# Patient Record
Sex: Female | Born: 1990
Health system: Southern US, Community
[De-identification: ages and names within clinical notes are randomized; demographics above are authoritative.]

## PROBLEM LIST (undated history)

## (undated) DIAGNOSIS — Z789 Other specified health status: Secondary | ICD-10-CM

## (undated) HISTORY — DX: Other specified health status: Z78.9

## (undated) HISTORY — PX: WISDOM TOOTH EXTRACTION: SHX21

---

## 2011-03-10 DIAGNOSIS — B009 Herpesviral infection, unspecified: Secondary | ICD-10-CM | POA: Insufficient documentation

## 2018-08-01 HISTORY — PX: PLACEMENT OF BREAST IMPLANTS: SHX6334

## 2019-04-20 ENCOUNTER — Other Ambulatory Visit: Payer: Self-pay

## 2019-04-20 DIAGNOSIS — Z20822 Contact with and (suspected) exposure to covid-19: Secondary | ICD-10-CM

## 2019-04-21 LAB — NOVEL CORONAVIRUS, NAA: SARS-CoV-2, NAA: NOT DETECTED

## 2019-04-22 DIAGNOSIS — Z20828 Contact with and (suspected) exposure to other viral communicable diseases: Secondary | ICD-10-CM | POA: Diagnosis not present

## 2019-05-30 DIAGNOSIS — L709 Acne, unspecified: Secondary | ICD-10-CM | POA: Diagnosis not present

## 2019-09-24 DIAGNOSIS — Z20828 Contact with and (suspected) exposure to other viral communicable diseases: Secondary | ICD-10-CM | POA: Diagnosis not present

## 2019-09-26 DIAGNOSIS — Z30432 Encounter for removal of intrauterine contraceptive device: Secondary | ICD-10-CM | POA: Diagnosis not present

## 2019-09-30 DIAGNOSIS — Z20828 Contact with and (suspected) exposure to other viral communicable diseases: Secondary | ICD-10-CM | POA: Diagnosis not present

## 2019-10-18 DIAGNOSIS — R05 Cough: Secondary | ICD-10-CM | POA: Diagnosis not present

## 2019-12-02 ENCOUNTER — Encounter: Payer: Self-pay | Admitting: Obstetrics

## 2019-12-02 ENCOUNTER — Other Ambulatory Visit: Payer: Self-pay

## 2019-12-02 ENCOUNTER — Inpatient Hospital Stay (HOSPITAL_COMMUNITY)
Admission: AD | Admit: 2019-12-02 | Discharge: 2019-12-02 | Disposition: A | Discharge status: Home / Self Care | Payer: BC Managed Care – PPO | Attending: Obstetrics and Gynecology | Admitting: Obstetrics and Gynecology

## 2019-12-02 DIAGNOSIS — M549 Dorsalgia, unspecified: Secondary | ICD-10-CM | POA: Diagnosis not present

## 2019-12-02 DIAGNOSIS — R1030 Lower abdominal pain, unspecified: Secondary | ICD-10-CM | POA: Diagnosis not present

## 2019-12-02 DIAGNOSIS — R109 Unspecified abdominal pain: Secondary | ICD-10-CM | POA: Diagnosis not present

## 2019-12-02 DIAGNOSIS — Z3490 Encounter for supervision of normal pregnancy, unspecified, unspecified trimester: Secondary | ICD-10-CM

## 2019-12-02 DIAGNOSIS — Z3202 Encounter for pregnancy test, result negative: Secondary | ICD-10-CM

## 2019-12-02 LAB — WET PREP, GENITAL
Clue Cells Wet Prep HPF POC: NONE SEEN
Sperm: NONE SEEN
Trich, Wet Prep: NONE SEEN
Yeast Wet Prep HPF POC: NONE SEEN

## 2019-12-02 LAB — HCG, QUANTITATIVE, PREGNANCY: hCG, Beta Chain, Quant, S: 17 m[IU]/mL — ABNORMAL HIGH (ref <5)

## 2019-12-02 MED ORDER — ACETAMINOPHEN 500 MG PO TABS: 1000.0000 mg | ORAL_TABLET | Freq: Once | ORAL | Status: DC

## 2019-12-02 MED ORDER — PREPLUS 27-1 MG PO TABS: 1.0000 | ORAL_TABLET | Freq: Once | ORAL | 3 refills | Status: AC

## 2019-12-02 NOTE — MAU Note (Signed)
Lacey Guerrero is a 29 y.o. here in MAU reporting: constant cramping since Friday. No bleeding, no discharge. 2 + UPT at home (last night and this morning).  LMP: 10/10/19  Onset of complaint: Friday  Pain score: 5/10  Vitals:   12/02/19 1418  BP: (!) 100/58  Pulse: 75  Resp: 16  Temp: 98.4 F (36.9 C)  SpO2: 100%     Lab orders placed from triage: UPT, hcg

## 2019-12-02 NOTE — Discharge Instructions (Signed)
First Trimester of Pregnancy The first trimester of pregnancy is from week 1 until the end of week 13 (months 1 through 3). A week after a sperm fertilizes an egg, the egg will implant on the wall of the uterus. This embryo will begin to develop into a baby. Genes from you and your partner will form the baby. The female genes will determine whether the baby will be a boy or a girl. At 6-8 weeks, the eyes and face will be formed, and the heartbeat can be seen on ultrasound. At the end of 12 weeks, all the baby's organs will be formed. Now that you are pregnant, you will want to do everything you can to have a healthy baby. Two of the most important things are to get good prenatal care and to follow your health care provider's instructions. Prenatal care is all the medical care you receive before the baby's birth. This care will help prevent, find, and treat any problems during the pregnancy and childbirth. Body changes during your first trimester Your body goes through many changes during pregnancy. The changes vary from woman to woman.  You may gain or lose a couple of pounds at first.  You may feel sick to your stomach (nauseous) and you may throw up (vomit). If the vomiting is uncontrollable, call your health care provider.  You may tire easily.  You may develop headaches that can be relieved by medicines. All medicines should be approved by your health care provider.  You may urinate more often. Painful urination may mean you have a bladder infection.  You may develop heartburn as a result of your pregnancy.  You may develop constipation because certain hormones are causing the muscles that push stool through your intestines to slow down.  You may develop hemorrhoids or swollen veins (varicose veins).  Your breasts may begin to grow larger and become tender. Your nipples may stick out more, and the tissue that surrounds them (areola) may become darker.  Your gums may bleed and may be  sensitive to brushing and flossing.  Dark spots or blotches (chloasma, mask of pregnancy) may develop on your face. This will likely fade after the baby is born.  Your menstrual periods will stop.  You may have a loss of appetite.  You may develop cravings for certain kinds of food.  You may have changes in your emotions from day to day, such as being excited to be pregnant or being concerned that something may go wrong with the pregnancy and baby.  You may have more vivid and strange dreams.  You may have changes in your hair. These can include thickening of your hair, rapid growth, and changes in texture. Some women also have hair loss during or after pregnancy, or hair that feels dry or thin. Your hair will most likely return to normal after your baby is born. What to expect at prenatal visits During a routine prenatal visit:  You will be weighed to make sure you and the baby are growing normally.  Your blood pressure will be taken.  Your abdomen will be measured to track your baby's growth.  The fetal heartbeat will be listened to between weeks 10 and 14 of your pregnancy.  Test results from any previous visits will be discussed. Your health care provider may ask you:  How you are feeling.  If you are feeling the baby move.  If you have had any abnormal symptoms, such as leaking fluid, bleeding, severe headaches, or abdominal   cramping.  If you are using any tobacco products, including cigarettes, chewing tobacco, and electronic cigarettes.  If you have any questions. Other tests that may be performed during your first trimester include:  Blood tests to find your blood type and to check for the presence of any previous infections. The tests will also be used to check for low iron levels (anemia) and protein on red blood cells (Rh antibodies). Depending on your risk factors, or if you previously had diabetes during pregnancy, you may have tests to check for high blood sugar  that affects pregnant women (gestational diabetes).  Urine tests to check for infections, diabetes, or protein in the urine.  An ultrasound to confirm the proper growth and development of the baby.  Fetal screens for spinal cord problems (spina bifida) and Down syndrome.  HIV (human immunodeficiency virus) testing. Routine prenatal testing includes screening for HIV, unless you choose not to have this test.  You may need other tests to make sure you and the baby are doing well. Follow these instructions at home: Medicines  Follow your health care provider's instructions regarding medicine use. Specific medicines may be either safe or unsafe to take during pregnancy.  Take a prenatal vitamin that contains at least 600 micrograms (mcg) of folic acid.  If you develop constipation, try taking a stool softener if your health care provider approves. Eating and drinking   Eat a balanced diet that includes fresh fruits and vegetables, whole grains, good sources of protein such as meat, eggs, or tofu, and low-fat dairy. Your health care provider will help you determine the amount of weight gain that is right for you.  Avoid raw meat and uncooked cheese. These carry germs that can cause birth defects in the baby.  Eating four or five small meals rather than three large meals a day may help relieve nausea and vomiting. If you start to feel nauseous, eating a few soda crackers can be helpful. Drinking liquids between meals, instead of during meals, also seems to help ease nausea and vomiting.  Limit foods that are high in fat and processed sugars, such as fried and sweet foods.  To prevent constipation: ? Eat foods that are high in fiber, such as fresh fruits and vegetables, whole grains, and beans. ? Drink enough fluid to keep your urine clear or pale yellow. Activity  Exercise only as directed by your health care provider. Most women can continue their usual exercise routine during  pregnancy. Try to exercise for 30 minutes at least 5 days a week. Exercising will help you: ? Control your weight. ? Stay in shape. ? Be prepared for labor and delivery.  Experiencing pain or cramping in the lower abdomen or lower back is a good sign that you should stop exercising. Check with your health care provider before continuing with normal exercises.  Try to avoid standing for long periods of time. Move your legs often if you must stand in one place for a long time.  Avoid heavy lifting.  Wear low-heeled shoes and practice good posture.  You may continue to have sex unless your health care provider tells you not to. Relieving pain and discomfort  Wear a good support bra to relieve breast tenderness.  Take warm sitz baths to soothe any pain or discomfort caused by hemorrhoids. Use hemorrhoid cream if your health care provider approves.  Rest with your legs elevated if you have leg cramps or low back pain.  If you develop varicose veins in   your legs, wear support hose. Elevate your feet for 15 minutes, 3-4 times a day. Limit salt in your diet. Prenatal care  Schedule your prenatal visits by the twelfth week of pregnancy. They are usually scheduled monthly at first, then more often in the last 2 months before delivery.  Write down your questions. Take them to your prenatal visits.  Keep all your prenatal visits as told by your health care provider. This is important. Safety  Wear your seat belt at all times when driving.  Make a list of emergency phone numbers, including numbers for family, friends, the hospital, and police and fire departments. General instructions  Ask your health care provider for a referral to a local prenatal education class. Begin classes no later than the beginning of month 6 of your pregnancy.  Ask for help if you have counseling or nutritional needs during pregnancy. Your health care provider can offer advice or refer you to specialists for help  with various needs.  Do not use hot tubs, steam rooms, or saunas.  Do not douche or use tampons or scented sanitary pads.  Do not cross your legs for long periods of time.  Avoid cat litter boxes and soil used by cats. These carry germs that can cause birth defects in the baby and possibly loss of the fetus by miscarriage or stillbirth.  Avoid all smoking, herbs, alcohol, and medicines not prescribed by your health care provider. Chemicals in these products affect the formation and growth of the baby.  Do not use any products that contain nicotine or tobacco, such as cigarettes and e-cigarettes. If you need help quitting, ask your health care provider. You may receive counseling support and other resources to help you quit.  Schedule a dentist appointment. At home, brush your teeth with a soft toothbrush and be gentle when you floss. Contact a health care provider if:  You have dizziness.  You have mild pelvic cramps, pelvic pressure, or nagging pain in the abdominal area.  You have persistent nausea, vomiting, or diarrhea.  You have a bad smelling vaginal discharge.  You have pain when you urinate.  You notice increased swelling in your face, hands, legs, or ankles.  You are exposed to fifth disease or chickenpox.  You are exposed to German measles (rubella) and have never had it. Get help right away if:  You have a fever.  You are leaking fluid from your vagina.  You have spotting or bleeding from your vagina.  You have severe abdominal cramping or pain.  You have rapid weight gain or loss.  You vomit blood or material that looks like coffee grounds.  You develop a severe headache.  You have shortness of breath.  You have any kind of trauma, such as from a fall or a car accident. Summary  The first trimester of pregnancy is from week 1 until the end of week 13 (months 1 through 3).  Your body goes through many changes during pregnancy. The changes vary from  woman to woman.  You will have routine prenatal visits. During those visits, your health care provider will examine you, discuss any test results you may have, and talk with you about how you are feeling. This information is not intended to replace advice given to you by your health care provider. Make sure you discuss any questions you have with your health care provider. Document Revised: 06/30/2017 Document Reviewed: 06/29/2016 Elsevier Patient Education  2020 Elsevier Inc.  

## 2019-12-02 NOTE — MAU Provider Note (Signed)
First Provider Initiated Contact with Patient 12/02/19 1427      S Lacey Guerrero is a 29 y.o. with no obstetric history on file. She presents to MAU today with complaint of abdominal cramping and back pain. Patient reports she had a positive pregnancy test this morning and 3 positive tests yesterday morning.  She reports March 11th was LMP, but she had negative home UPT 2 weeks ago. She reports some lower abdominal cramping and back pain that she describes as "menstrual cramping."   Patient points to lower abdominal upper pelvic area.  She reports that she had a Paragard IUD removed at the end of Feb.  She states she was having "pretty regular" menstrual cycles while on the IUD. Patient states that she has been experiencing cramping since Friday.   O BP (!) 100/58 (BP Location: Right Arm)   Pulse 75   Temp 98.4 F (36.9 C) (Oral)   Resp 16   LMP 10/10/2019   SpO2 100% Comment: room air Physical Exam  Constitutional: She is oriented to person, place, and time. She appears well-developed and well-nourished.  HENT:  Head: Normocephalic and atraumatic.  Eyes: Conjunctivae are normal.  Cardiovascular: Normal rate.  Respiratory: Effort normal.  GI: There is abdominal tenderness in the suprapubic area.  Right side pressure.  Genitourinary: Uterus is not enlarged and not tender. Cervix exhibits friability. Cervix exhibits no motion tenderness and no discharge.    Vaginal discharge present.     No vaginal bleeding.  No bleeding in the vagina.    Genitourinary Comments: Speculum Exam: -Normal External Genitalia: Non tender, no apparent discharge at introitus.  -Vaginal Vault: Pink mucosa with good rugae. Small amt thin white discharge -wet prep collected -Cervix:Pink, no lesions, cysts, or polyps.  Friable. Appears closed. No active bleeding from os-GC/CT collected -Bimanual Exam:      Musculoskeletal:        General: Normal range of motion.     Cervical back: Normal range of motion.   Neurological: She is alert and oriented to person, place, and time.  Skin: Skin is warm and dry.  Psychiatric: She has a normal mood and affect. Her behavior is normal.   Results for orders placed or performed during the hospital encounter of 12/02/19 (from the past 24 hour(s))  hCG, quantitative, pregnancy     Status: Abnormal   Collection Time: 12/02/19  2:24 PM  Result Value Ref Range   hCG, Beta Chain, Quant, S 17 (H) <5 mIU/mL  Wet prep, genital     Status: Abnormal   Collection Time: 12/02/19  3:42 PM   Specimen: PATH Cytology Cervicovaginal Ancillary Only  Result Value Ref Range   Yeast Wet Prep HPF POC NONE SEEN NONE SEEN   Trich, Wet Prep NONE SEEN NONE SEEN   Clue Cells Wet Prep HPF POC NONE SEEN NONE SEEN   WBC, Wet Prep HPF POC FEW (A) NONE SEEN   Sperm NONE SEEN     A Positive Home UPT/Negative UPT on site Medical screening exam complete  Abdominal Cramping Back Pain   P -Quant performed and returns at 64. -Results discussed with patient. -Patient informed of need for repeat testing in 48 hours and reports she lives in [place]. -Scheduled for repeat quant on Wednesday May 5th at 1330. -Patient with various questions and concerns regarding pregnancy, risks for miscarriage, which were addressed thoroughly by provider. -Patient appears reassured by discussion and informaiton provided. -Offered pelvic examination with testing to rule out underlying  infection as reason for abdominal cramping. -Patient desires and exam as above. -Cultures collected -Patient offered and declines pain medication. -Will send results via mychart. -Written script given for PNV. -Warning signs for worsening condition that would warrant emergency follow-up discussed -Discharge from MAU in stable condition. -Patient may return to MAU as needed for pregnancy related complaints  Gerrit Heck, CNM 12/02/2019 2:27 PM

## 2019-12-03 LAB — GC/CHLAMYDIA PROBE AMP (~~LOC~~) NOT AT ARMC
Chlamydia: NEGATIVE
Comment: NEGATIVE
Comment: NORMAL
Neisseria Gonorrhea: NEGATIVE

## 2019-12-04 ENCOUNTER — Ambulatory Visit (INDEPENDENT_AMBULATORY_CARE_PROVIDER_SITE_OTHER): Payer: BC Managed Care – PPO | Admitting: Family Medicine

## 2019-12-04 ENCOUNTER — Ambulatory Visit: Payer: BC Managed Care – PPO

## 2019-12-04 ENCOUNTER — Other Ambulatory Visit: Payer: BC Managed Care – PPO

## 2019-12-04 ENCOUNTER — Other Ambulatory Visit: Payer: Self-pay

## 2019-12-04 DIAGNOSIS — O039 Complete or unspecified spontaneous abortion without complication: Secondary | ICD-10-CM | POA: Diagnosis not present

## 2019-12-04 DIAGNOSIS — Z3490 Encounter for supervision of normal pregnancy, unspecified, unspecified trimester: Secondary | ICD-10-CM | POA: Diagnosis not present

## 2019-12-04 LAB — BETA HCG QUANT (REF LAB): hCG Quant: 6 m[IU]/mL

## 2019-12-04 NOTE — Progress Notes (Signed)
Patient is here to discuss her results. HCG 6

## 2019-12-04 NOTE — Progress Notes (Signed)
    MISCARRIAGE follow up  Subjective:   Lacey Guerrero is a 29 y.o. [phone] female here for miscarriage. Reports starting with cramping and bleeding this morning.  Reports being very sad and also emotional this AM. Brought her mother her for support. This was desired pregnancy, trying for second child.  Denies excessive bleeding, dizziness, lightheadedness and abdominal pain.   The following portions of the patient's history were reviewed and updated as appropriate: allergies, current medications, past family history, past medical history, past social history, past surgical history and problem list.  Review of Systems Pertinent items are noted in HPI.   Objective:  There were no vitals taken for this visit. Gen: well appearing, NAD HEENT: no scleral icterus CV: RR Lung: Normal WOB Ext: warm well perfused  5/3 bhCG= 17 5/5 bHCG= 6   Assessment and Plan:  1. Miscarriage within last 12 months - reviewed preconception health  - reviewed contraception and basic fertility. Patient desires pregnancy ASAP and will use condoms until next period. Recommended waiting to try until after next normal period. Reviewed that miscarriage is common and we might do a work up for clotting disorders if she experiences another miscarriage.  - provided resources and support regarding pregnancy loss.    Face to face time:  20 minutes  Greater than 50% of the visit time was spent in counseling and coordination of care with the patient.  The summary and outline of the counseling and care coordination is summarized in the note above.   All questions were answered.  Please refer to After Visit Summary for other counseling recommendations.   Return if symptoms worsen or fail to improve.

## 2019-12-06 ENCOUNTER — Encounter: Payer: Self-pay | Admitting: Family Medicine

## 2020-01-20 ENCOUNTER — Other Ambulatory Visit: Payer: Self-pay

## 2020-01-20 ENCOUNTER — Other Ambulatory Visit: Payer: BC Managed Care – PPO

## 2020-01-20 DIAGNOSIS — Z3201 Encounter for pregnancy test, result positive: Secondary | ICD-10-CM | POA: Diagnosis not present

## 2020-01-21 LAB — BETA HCG QUANT (REF LAB): hCG Quant: 20736 m[IU]/mL

## 2020-02-11 ENCOUNTER — Ambulatory Visit (INDEPENDENT_AMBULATORY_CARE_PROVIDER_SITE_OTHER): Payer: BC Managed Care – PPO | Admitting: Obstetrics and Gynecology

## 2020-02-11 ENCOUNTER — Encounter: Payer: Self-pay | Admitting: Obstetrics and Gynecology

## 2020-02-11 ENCOUNTER — Other Ambulatory Visit (HOSPITAL_COMMUNITY)
Admission: RE | Admit: 2020-02-11 | Discharge: 2020-02-11 | Disposition: A | Discharge status: Home / Self Care | Payer: BC Managed Care – PPO | Source: Ambulatory Visit | Attending: Obstetrics and Gynecology | Admitting: Obstetrics and Gynecology

## 2020-02-11 ENCOUNTER — Other Ambulatory Visit: Payer: Self-pay

## 2020-02-11 VITALS — BP 116/75 | HR 91 | Ht 66.0 in | Wt 144.0 lb

## 2020-02-11 DIAGNOSIS — O09291 Supervision of pregnancy with other poor reproductive or obstetric history, first trimester: Secondary | ICD-10-CM

## 2020-02-11 DIAGNOSIS — O99611 Diseases of the digestive system complicating pregnancy, first trimester: Secondary | ICD-10-CM

## 2020-02-11 DIAGNOSIS — K429 Umbilical hernia without obstruction or gangrene: Secondary | ICD-10-CM | POA: Insufficient documentation

## 2020-02-11 DIAGNOSIS — Z3A09 9 weeks gestation of pregnancy: Secondary | ICD-10-CM

## 2020-02-11 DIAGNOSIS — O210 Mild hyperemesis gravidarum: Secondary | ICD-10-CM

## 2020-02-11 DIAGNOSIS — Z3401 Encounter for supervision of normal first pregnancy, first trimester: Secondary | ICD-10-CM | POA: Insufficient documentation

## 2020-02-11 DIAGNOSIS — O09891 Supervision of other high risk pregnancies, first trimester: Secondary | ICD-10-CM | POA: Insufficient documentation

## 2020-02-11 DIAGNOSIS — Z3480 Encounter for supervision of other normal pregnancy, unspecified trimester: Secondary | ICD-10-CM | POA: Insufficient documentation

## 2020-02-11 MED ORDER — BONJESTA 20-20 MG PO TBCR: 1.0000 | EXTENDED_RELEASE_TABLET | Freq: Every day | ORAL | 1 refills | Status: DC

## 2020-02-11 NOTE — Progress Notes (Signed)
Pt. Request medication for nausea

## 2020-02-12 ENCOUNTER — Telehealth: Payer: Self-pay

## 2020-02-12 LAB — CERVICOVAGINAL ANCILLARY ONLY
Chlamydia: NEGATIVE
Comment: NEGATIVE
Comment: NORMAL
Neisseria Gonorrhea: NEGATIVE

## 2020-02-12 NOTE — Progress Notes (Signed)
New OB Note  02/11/2020   [facility]inic: [facility]  Chief Complaint: new OB  Transfer of Care Patient: no  History of Present Illness: Ms. Lacey Guerrero is a 29 y.o. G3P1011 @ 9/0 weeks (EDC 2/15, based on 9wk u/s today).  Preg complicated by has Encounter for supervision of normal first pregnancy in first trimester; Medication exposure during first trimester of pregnancy; and Umbilical hernia without obstruction and without gangrene on their problem list.   Any events prior to today's visit: patient had recent miscarriage in May and no LMP but she believes her EDC is what we saw on ultrasound today based on date of conception Her periods were: qmonth, regular prior to recent miscarriage She was using no method when she conceived.  She has Positive signs or symptoms of nausea/vomiting of pregnancy. She has Negative signs or symptoms of miscarriage or preterm labor On any medications around the time she conceived/early pregnancy: retinol cream which she stopped very soon after home UPT  ROS: A 12-point review of systems was performed and negative, except as stated in the above HPI.  OBGYN History: As per HPI. OB History  Gravida Para Term Preterm AB Living  3 1 1   1 1   SAB TAB Ectopic Multiple Live Births  1       1    # Outcome Date GA Lbr Len/2nd Weight Sex Delivery Anes PTL Lv  3 Current           2 SAB 11/2019          1 Term 02/17/17 [redacted]w[redacted]d  6 lb 7.4 oz (2.93 kg)  Vag-Spont  N LIV    Any issues with any prior pregnancies: no Prior children are healthy, doing well, and without any problems or issues: yes History of pap smears: Yes. Last pap smear 2019 and results were negative   Past Medical History: No past medical history on file.  Past Surgical History: Past Surgical History:  Procedure Laterality Date  . PLACEMENT OF BREAST IMPLANTS Bilateral 2020    Family History:  No family history on file.  Social History:  Social History    Socioeconomic History  . Marital status: Married    Spouse name: Not on file  . Number of children: Not on file  . Years of education: Not on file  . Highest education level: Not on file  Occupational History  . Not on file  Tobacco Use  . Smoking status: Not on file  Substance and Sexual Activity  . Alcohol use: Not on file  . Drug use: Not on file  . Sexual activity: Not on file  Other Topics Concern  . Not on file  Social History Narrative  . Not on file   Social Determinants of Health   Financial Resource Strain:   . Difficulty of Paying Living Expenses:   Food Insecurity:   . Worried About 2021 in the Last Year:   . Programme researcher, broadcasting/film/video in the Last Year:   Transportation Needs:   . Barista (Medical):   Freight forwarder Lack of Transportation (Non-Medical):   Physical Activity:   . Days of Exercise per Week:   . Minutes of Exercise per Session:   Stress:   . Feeling of Stress :   Social Connections:   . Frequency of Communication with Friends and Family:   . Frequency of Social Gatherings with Friends and Family:   . Attends Religious Services:   .  Active Member of Clubs or Organizations:   . Attends Banker Meetings:   Marland Kitchen Marital Status:   Intimate Partner Violence:   . Fear of Current or Ex-Partner:   . Emotionally Abused:   Marland Kitchen Physically Abused:   . Sexually Abused:     Allergy: Not on File  Health Maintenance:  Mammogram Up to Date: not applicable  Current Outpatient Medications: Prenatal vitamin  Physical Exam:   BP 116/75   Pulse 91   Ht 5\' 6"  (1.676 m)   Wt 144 lb (65.3 kg)   LMP 10/10/2019   BMI 23.24 kg/m  Body mass index is 23.24 kg/m. Contractions: Not present Vag. Bleeding: None. Fundal height: not applicable FHTs: 180s  General appearance: Well nourished, well developed female in no acute distress.  Neck:  Supple, normal appearance, and no thyromegaly  Cardiovascular: S1, S2 normal, no murmur, rub or  gallop, regular rate and rhythm Respiratory:  Clear to auscultation bilateral. Normal respiratory effort Abdomen: soft, nttp, nd. Small 2cm umbilical hernia noted, nttp Breasts: bilateral breast implants, no obvious masses, no discharge. Well healed surgical scars. Neuro/Psych:  Normal mood and affect.  Skin:  Warm and dry.   Laboratory: none  Imaging:  Bedside u/s-SLIUP, +FM, 9/0 via CRL, FHR 180s  Assessment: pt doing well  Plan: 1. Encounter for supervision of normal first pregnancy in first trimester Routine care. Bonjesta sent in. Pt to come back at 10 weeks or greater for NIPS testing - CBC/D/Plt+RPR+Rh+ABO+Rub Ab...; Future - Culture, OB Urine - Genetic Screening - Enroll patient in Babyscripts Program - 12/10/2019 MFM Fetal Nuchal Translucency; Future - Cervicovaginal ancillary only( [place]) - VITAMIN D 25 Hydroxy (Vit-D Deficiency, Fractures); Future  2. Medication exposure during first trimester of pregnancy Will get NT u/s but I told her that likely shouldn't be an issue  3. Umbilical hernia without obstruction and without gangrene Precautions noted. She wonders about fixing this and I told her I recommend after her pregnancy and when she is done with childbearing. ED precautions given  4. Morning sickness  Problem list reviewed and updated.  Follow up in 1wk for labs and 4-5 weeks for ROB  The nature of  - [facility] with multiple MDs and other Advanced Practice Providers was explained to patient; also emphasized that residents, students are part of our team. She desires that she doesn't want residents, students. I d/w her CNMs, fellows, Attending coverage.   >50% of 30 min visit spent on counseling and coordination of care.     [facility] MD Attending [facility] for [facility])

## 2020-02-12 NOTE — Telephone Encounter (Signed)
Patient called stating her insurance company is requiring a PA for the [place]. I have submitted all documentation to her bc/bs insurance company for review. The turn around time for this is 24-48. Patient has been made aware of this.

## 2020-02-13 ENCOUNTER — Telehealth: Payer: Self-pay

## 2020-02-13 LAB — URINE CULTURE, OB REFLEX

## 2020-02-13 LAB — CULTURE, OB URINE

## 2020-02-13 NOTE — Telephone Encounter (Signed)
Patient insurance approved her taking bonjesta but it is to expensive. Per Dr.Anyanwu called in and Phenergan  25 mg prn Q 6 and  Reglan 10 mg po qh prn nausea. If these two medication do not work she can try Zofran.This has not been called in to her pharmacy.

## 2020-02-18 ENCOUNTER — Other Ambulatory Visit: Payer: BC Managed Care – PPO

## 2020-02-18 ENCOUNTER — Other Ambulatory Visit: Payer: Self-pay

## 2020-02-18 DIAGNOSIS — Z3401 Encounter for supervision of normal first pregnancy, first trimester: Secondary | ICD-10-CM

## 2020-02-18 DIAGNOSIS — R5383 Other fatigue: Secondary | ICD-10-CM | POA: Diagnosis not present

## 2020-02-19 LAB — CBC/D/PLT+RPR+RH+ABO+RUB AB...
Antibody Screen: NEGATIVE
Basophils Absolute: 0 10*3/uL (ref 0.0–0.2)
Basos: 0 %
EOS (ABSOLUTE): 0.1 10*3/uL (ref 0.0–0.4)
Eos: 1 %
HCV Ab: <0.1 s/co ratio (ref 0.0–0.9)
HIV Screen 4th Generation wRfx: NONREACTIVE
Hematocrit: 37.2 % (ref 34.0–46.6)
Hemoglobin: 13 g/dL (ref 11.1–15.9)
Hepatitis B Surface Ag: NEGATIVE
Immature Grans (Abs): 0 10*3/uL (ref 0.0–0.1)
Immature Granulocytes: 0 %
Lymphocytes Absolute: 1.7 10*3/uL (ref 0.7–3.1)
Lymphs: 18 %
MCH: 31.6 pg (ref 26.6–33.0)
MCHC: 34.9 g/dL (ref 31.5–35.7)
MCV: 91 fL (ref 79–97)
Monocytes Absolute: 0.4 10*3/uL (ref 0.1–0.9)
Monocytes: 4 %
Neutrophils Absolute: 7.4 10*3/uL — ABNORMAL HIGH (ref 1.4–7.0)
Neutrophils: 77 %
Platelets: 222 10*3/uL (ref 150–450)
RBC: 4.11 x10E6/uL (ref 3.77–5.28)
RDW: 11.8 % (ref 11.7–15.4)
RPR Ser Ql: NONREACTIVE
Rh Factor: POSITIVE
Rubella Antibodies, IGG: 1.52 index (ref >0.99)
WBC: 9.6 10*3/uL (ref 3.4–10.8)

## 2020-02-19 LAB — HCV INTERPRETATION

## 2020-02-19 LAB — VITAMIN D 25 HYDROXY (VIT D DEFICIENCY, FRACTURES): Vit D, 25-Hydroxy: 36.1 ng/mL (ref 30.0–100.0)

## 2020-02-25 ENCOUNTER — Telehealth: Payer: Self-pay | Admitting: *Deleted

## 2020-02-25 ENCOUNTER — Encounter: Payer: Self-pay | Admitting: *Deleted

## 2020-02-25 NOTE — Telephone Encounter (Signed)
Left message for pt to call back regarding her panorama results.  

## 2020-02-25 NOTE — Telephone Encounter (Signed)
Pt informed of panorama results  ?

## 2020-03-10 ENCOUNTER — Encounter: Payer: BC Managed Care – PPO | Admitting: Obstetrics & Gynecology

## 2020-03-11 ENCOUNTER — Ambulatory Visit: Payer: BC Managed Care – PPO

## 2020-03-16 ENCOUNTER — Other Ambulatory Visit: Payer: Self-pay | Admitting: *Deleted

## 2020-03-16 ENCOUNTER — Ambulatory Visit: Payer: BC Managed Care – PPO

## 2020-03-16 ENCOUNTER — Other Ambulatory Visit: Payer: Self-pay | Admitting: Obstetrics and Gynecology

## 2020-03-16 ENCOUNTER — Ambulatory Visit (HOSPITAL_BASED_OUTPATIENT_CLINIC_OR_DEPARTMENT_OTHER): Payer: BC Managed Care – PPO

## 2020-03-16 ENCOUNTER — Other Ambulatory Visit: Payer: Self-pay

## 2020-03-16 ENCOUNTER — Ambulatory Visit: Payer: BC Managed Care – PPO | Attending: Obstetrics and Gynecology | Admitting: *Deleted

## 2020-03-16 ENCOUNTER — Encounter: Payer: Self-pay | Admitting: *Deleted

## 2020-03-16 VITALS — BP 108/78 | HR 71

## 2020-03-16 DIAGNOSIS — F1590 Other stimulant use, unspecified, uncomplicated: Secondary | ICD-10-CM | POA: Diagnosis not present

## 2020-03-16 DIAGNOSIS — Z363 Encounter for antenatal screening for malformations: Secondary | ICD-10-CM | POA: Diagnosis not present

## 2020-03-16 DIAGNOSIS — Z3A13 13 weeks gestation of pregnancy: Secondary | ICD-10-CM

## 2020-03-16 DIAGNOSIS — Z3401 Encounter for supervision of normal first pregnancy, first trimester: Secondary | ICD-10-CM

## 2020-03-16 DIAGNOSIS — Z369 Encounter for antenatal screening, unspecified: Secondary | ICD-10-CM

## 2020-03-16 DIAGNOSIS — O99321 Drug use complicating pregnancy, first trimester: Secondary | ICD-10-CM | POA: Insufficient documentation

## 2020-03-31 ENCOUNTER — Encounter: Payer: Self-pay | Admitting: Obstetrics and Gynecology

## 2020-03-31 ENCOUNTER — Other Ambulatory Visit: Payer: Self-pay

## 2020-03-31 ENCOUNTER — Ambulatory Visit (INDEPENDENT_AMBULATORY_CARE_PROVIDER_SITE_OTHER): Payer: BC Managed Care – PPO | Admitting: Obstetrics and Gynecology

## 2020-03-31 VITALS — BP 108/67 | HR 71 | Wt 149.2 lb

## 2020-03-31 DIAGNOSIS — Z3401 Encounter for supervision of normal first pregnancy, first trimester: Secondary | ICD-10-CM | POA: Diagnosis not present

## 2020-03-31 DIAGNOSIS — K429 Umbilical hernia without obstruction or gangrene: Secondary | ICD-10-CM

## 2020-03-31 NOTE — Progress Notes (Signed)
   PRENATAL VISIT NOTE  Subjective:  Lacey Guerrero is a 29 y.o. G3P1011 at [redacted]w[redacted]d being seen today for ongoing prenatal care.  She is currently monitored for the following issues for this low-risk pregnancy and has Encounter for supervision of normal first pregnancy in first trimester; Medication exposure during first trimester of pregnancy; and Umbilical hernia without obstruction and without gangrene on their problem list.  Patient reports no complaints.  Contractions: Not present.  .  Movement: Present. Denies leaking of fluid.   The following portions of the patient's history were reviewed and updated as appropriate: allergies, current medications, past family history, past medical history, past social history, past surgical history and problem list.   Objective:   Vitals:   03/31/20 1357  BP: 108/67  Pulse: 71  Weight: 149 lb 3.2 oz (67.7 kg)    Fetal Status: Fetal Heart Rate (bpm): 143   Movement: Present     General:  Alert, oriented and cooperative. Patient is in no acute distress.  Skin: Skin is warm and dry. No rash noted.   Cardiovascular: Normal heart rate noted  Respiratory: Normal respiratory effort, no problems with respiration noted  Abdomen: Soft, gravid, appropriate for gestational age.  Pain/Pressure: Absent     Pelvic: Cervical exam deferred        Extremities: Normal range of motion.     Mental Status: Normal mood and affect. Normal behavior. Normal judgment and thought content.   Assessment and Plan:  Pregnancy: G3P1011 at [redacted]w[redacted]d 1. Encounter for supervision of normal first pregnancy in first trimester Patient is doing well without complaints Anatomy ultrasound scheduled 9/20 AFP today  2. Umbilical hernia without obstruction and without gangrene Stable  Preterm labor symptoms and general obstetric precautions including but not limited to vaginal bleeding, contractions, leaking of fluid and fetal movement were reviewed in detail with the patient. Please  refer to After Visit Summary for other counseling recommendations.   Return in about 4 weeks (around 04/28/2020) for in person, ROB, Low risk.  Future Appointments  Date Time Provider Department Center  04/20/2020  2:45 PM [facility] US4 [facility]    Catalina Antigua, MD

## 2020-04-01 DIAGNOSIS — Z20822 Contact with and (suspected) exposure to covid-19: Secondary | ICD-10-CM | POA: Diagnosis not present

## 2020-04-01 LAB — AFP, SERUM, OPEN SPINA BIFIDA
AFP MoM: 1.11
AFP Value: 38.3 ng/mL
Gest. Age on Collection Date: 16 weeks
Maternal Age At EDD: 29.2 yr
OSBR Risk 1 IN: 8647
Test Results:: NEGATIVE
Weight: 149 [lb_av]

## 2020-04-20 ENCOUNTER — Ambulatory Visit: Payer: BC Managed Care – PPO | Attending: Obstetrics and Gynecology

## 2020-04-20 ENCOUNTER — Other Ambulatory Visit: Payer: Self-pay

## 2020-04-20 ENCOUNTER — Other Ambulatory Visit: Payer: Self-pay | Admitting: Obstetrics and Gynecology

## 2020-04-20 DIAGNOSIS — O358XX0 Maternal care for other (suspected) fetal abnormality and damage, not applicable or unspecified: Secondary | ICD-10-CM

## 2020-04-20 DIAGNOSIS — Z363 Encounter for antenatal screening for malformations: Secondary | ICD-10-CM

## 2020-04-20 DIAGNOSIS — O321XX0 Maternal care for breech presentation, not applicable or unspecified: Secondary | ICD-10-CM | POA: Diagnosis not present

## 2020-04-20 DIAGNOSIS — Z3A18 18 weeks gestation of pregnancy: Secondary | ICD-10-CM | POA: Diagnosis not present

## 2020-04-21 ENCOUNTER — Other Ambulatory Visit: Payer: Self-pay | Admitting: *Deleted

## 2020-04-21 DIAGNOSIS — Z3689 Encounter for other specified antenatal screening: Secondary | ICD-10-CM

## 2020-04-28 ENCOUNTER — Other Ambulatory Visit: Payer: Self-pay

## 2020-04-28 ENCOUNTER — Ambulatory Visit (INDEPENDENT_AMBULATORY_CARE_PROVIDER_SITE_OTHER): Payer: BC Managed Care – PPO | Admitting: Family Medicine

## 2020-04-28 DIAGNOSIS — Z3401 Encounter for supervision of normal first pregnancy, first trimester: Secondary | ICD-10-CM

## 2020-04-28 DIAGNOSIS — L709 Acne, unspecified: Secondary | ICD-10-CM | POA: Insufficient documentation

## 2020-04-28 NOTE — Progress Notes (Signed)
Discuss acne meds

## 2020-04-28 NOTE — Progress Notes (Signed)
   PRENATAL VISIT NOTE  Subjective:  Lacey Guerrero is a 29 y.o. G3P1011 at [redacted]w[redacted]d being seen today for ongoing prenatal care.  She is currently monitored for the following issues for this low-risk pregnancy and has Encounter for supervision of normal first pregnancy in first trimester; Medication exposure during first trimester of pregnancy; Umbilical hernia without obstruction and without gangrene; Acne; and Herpes simplex type 1 infection on their problem list.  Patient reports no complaints.  Contractions: Not present. Vag. Bleeding: None.  Movement: Present. Denies leaking of fluid.   The following portions of the patient's history were reviewed and updated as appropriate: allergies, current medications, past family history, past medical history, past social history, past surgical history and problem list.   Objective:   Vitals:   04/28/20 1349  BP: 103/68  Pulse: 69  Weight: 154 lb (69.9 kg)    Fetal Status: Fetal Heart Rate (bpm): 168 Fundal Height: 20 cm Movement: Present     General:  Alert, oriented and cooperative. Patient is in no acute distress.  Skin: Skin is warm and dry. No rash noted.   Cardiovascular: Normal heart rate noted  Respiratory: Normal respiratory effort, no problems with respiration noted  Abdomen: Soft, gravid, appropriate for gestational age.  Pain/Pressure: Absent     Pelvic: Cervical exam deferred        Extremities: Normal range of motion.  Edema: None  Mental Status: Normal mood and affect. Normal behavior. Normal judgment and thought content.   Assessment and Plan:  Pregnancy: G3P1011 at [redacted]w[redacted]d 1. Encounter for supervision of normal first pregnancy in first trimester Continue routine prenatal care. Anatomy US WNL AFP nml  2. Acne Most topical treatments ok Reviewed meds, and no retin-As noted  General obstetric precautions including but not limited to vaginal bleeding, contractions, leaking of fluid and fetal movement were reviewed in detail  with the patient. Please refer to After Visit Summary for other counseling recommendations.   Return in 4 weeks (on 05/26/2020).  Future Appointments  Date Time Provider Department Center  05/27/2020  2:10 PM Briant Sites [facility]  06/01/2020 11:30 AM [facility] US3 [facility]  06/01/2020  1:00 PM [facility] NURSE [facility]    Reva Bores, MD

## 2020-04-28 NOTE — Patient Instructions (Signed)

## 2020-05-27 ENCOUNTER — Ambulatory Visit (INDEPENDENT_AMBULATORY_CARE_PROVIDER_SITE_OTHER): Payer: BC Managed Care – PPO | Admitting: Advanced Practice Midwife

## 2020-05-27 ENCOUNTER — Other Ambulatory Visit: Payer: Self-pay

## 2020-05-27 VITALS — BP 107/67 | HR 66 | Wt 160.0 lb

## 2020-05-27 DIAGNOSIS — Z3402 Encounter for supervision of normal first pregnancy, second trimester: Secondary | ICD-10-CM

## 2020-05-27 DIAGNOSIS — Z23 Encounter for immunization: Secondary | ICD-10-CM

## 2020-05-27 DIAGNOSIS — Z9882 Breast implant status: Secondary | ICD-10-CM

## 2020-05-27 NOTE — Patient Instructions (Signed)
Glucose Tolerance Test During Pregnancy Why am I having this test? The glucose tolerance test (GTT) is done to check how your body processes sugar (glucose). This is one of several tests used to diagnose diabetes that develops during pregnancy (gestational diabetes mellitus). Gestational diabetes is a temporary form of diabetes that some women develop during pregnancy. It usually occurs during the second trimester of pregnancy and goes away after delivery. Testing (screening) for gestational diabetes usually occurs between 24 and 28 weeks of pregnancy. You may have the GTT test after having a 1-hour glucose screening test if the results from that test indicate that you may have gestational diabetes. You may also have this test if:  You have a history of gestational diabetes.  You have a history of giving birth to very large babies or have experienced repeated fetal loss (stillbirth).  You have signs and symptoms of diabetes, such as: ? Changes in your vision. ? Tingling or numbness in your hands or feet. ? Changes in hunger, thirst, and urination that are not otherwise explained by your pregnancy. What is being tested? This test measures the amount of glucose in your blood at different times during a period of 3 hours. This indicates how well your body is able to process glucose. What kind of sample is taken?  Blood samples are required for this test. They are usually collected by inserting a needle into a blood vessel. How do I prepare for this test?  For 3 days before your test, eat normally. Have plenty of carbohydrate-rich foods.  Follow instructions from your health care provider about: ? Eating or drinking restrictions on the day of the test. You may be asked to not eat or drink anything other than water (fast) starting 8-10 hours before the test. ? Changing or stopping your regular medicines. Some medicines may interfere with this test. Tell a health care provider about:  All  medicines you are taking, including vitamins, herbs, eye drops, creams, and over-the-counter medicines.  Any blood disorders you have.  Any surgeries you have had.  Any medical conditions you have. What happens during the test? First, your blood glucose will be measured. This is referred to as your fasting blood glucose, since you fasted before the test. Then, you will drink a glucose solution that contains a certain amount of glucose. Your blood glucose will be measured again 1, 2, and 3 hours after drinking the solution. This test takes about 3 hours to complete. You will need to stay at the testing location during this time. During the testing period:  Do not eat or drink anything other than the glucose solution.  Do not exercise.  Do not use any products that contain nicotine or tobacco, such as cigarettes and e-cigarettes. If you need help stopping, ask your health care provider. The testing procedure may vary among health care providers and hospitals. How are the results reported? Your results will be reported as milligrams of glucose per deciliter of blood (mg/dL) or millimoles per liter (mmol/L). Your health care provider will compare your results to normal ranges that were established after testing a large group of people (reference ranges). Reference ranges may vary among labs and hospitals. For this test, common reference ranges are:  Fasting: less than 95-105 mg/dL (5.3-5.8 mmol/L).  1 hour after drinking glucose: less than 180-190 mg/dL (10.0-10.5 mmol/L).  2 hours after drinking glucose: less than 155-165 mg/dL (8.6-9.2 mmol/L).  3 hours after drinking glucose: 140-145 mg/dL (7.8-8.1 mmol/L). What do the   results mean? Results within reference ranges are considered normal, meaning that your glucose levels are well-controlled. If two or more of your blood glucose levels are high, you may be diagnosed with gestational diabetes. If only one level is high, your health care  provider may suggest repeat testing or other tests to confirm a diagnosis. Talk with your health care provider about what your results mean. Questions to ask your health care provider Ask your health care provider, or the department that is doing the test:  When will my results be ready?  How will I get my results?  What are my treatment options?  What other tests do I need?  What are my next steps? Summary  The glucose tolerance test (GTT) is one of several tests used to diagnose diabetes that develops during pregnancy (gestational diabetes mellitus). Gestational diabetes is a temporary form of diabetes that some women develop during pregnancy.  You may have the GTT test after having a 1-hour glucose screening test if the results from that test indicate that you may have gestational diabetes. You may also have this test if you have any symptoms or risk factors for gestational diabetes.  Talk with your health care provider about what your results mean. This information is not intended to replace advice given to you by your health care provider. Make sure you discuss any questions you have with your health care provider. Document Revised: 11/08/2018 Document Reviewed: 02/27/2017 Elsevier Patient Education  2020 Elsevier Inc.  

## 2020-05-27 NOTE — Progress Notes (Signed)
   PRENATAL VISIT NOTE  Subjective:  Lacey Guerrero is a 29 y.o. G3P1011 at [redacted]w[redacted]d being seen today for ongoing prenatal care.  She is currently monitored for the following issues for this low-risk pregnancy and has Encounter for supervision of normal first pregnancy in first trimester; Medication exposure during first trimester of pregnancy; Umbilical hernia without obstruction and without gangrene; Acne; and Herpes simplex type 1 infection on their problem list.  Patient reports concerns regarding "small bumps" on the medial aspect of her right breast. Patient also endorses intermittent tenderness at the same point. She does not typically wear an underwire bra but does sleep in a bra. She denies palpable lumps or tenderness today. She is beginning to feel nervous about breastfeeding s/p augmentation.  Contractions: Not present. Vag. Bleeding: None.  Movement: Present. Denies leaking of fluid.   The following portions of the patient's history were reviewed and updated as appropriate: allergies, current medications, past family history, past medical history, past social history, past surgical history and problem list. Problem list updated.  Objective:   Vitals:   05/27/20 1415  BP: 107/67  Pulse: 66  Weight: 160 lb (72.6 kg)    Fetal Status: Fetal Heart Rate (bpm): 152 Fundal Height: 24 cm Movement: Present     General:  Alert, oriented and cooperative. Patient is in no acute distress.  Skin: Skin is warm and dry. No rash noted.   Cardiovascular: Normal heart rate noted  Respiratory: Normal respiratory effort, no problems with respiration noted  Abdomen: Soft, gravid, appropriate for gestational age.  Pain/Pressure: Absent     Pelvic: Cervical exam deferred        Extremities: Normal range of motion.  Edema: None  Mental Status: Normal mood and affect. Normal behavior. Normal judgment and thought content.   Assessment and Plan:  Pregnancy: G3P1011 at [redacted]w[redacted]d  1. Encounter for supervision  of normal first pregnancy in second trimester - EFW 87%, upcoming repeat MFM scan - Preemptive teaching GTT next visit  2. Hx of breast augmentation -No palpable lumps or tenderness on exam today today, continue to monitor - Reassured augmentation does not necessarily influence milk supply   Preterm labor symptoms and general obstetric precautions including but not limited to vaginal bleeding, contractions, leaking of fluid and fetal movement were reviewed in detail with the patient. Please refer to After Visit Summary for other counseling recommendations.  Return in about 4 weeks (around 06/24/2020) for GTT and LOB.  Future Appointments  Date Time Provider Department Center  06/01/2020 11:30 AM [facility] US3 [facility]  07/01/2020  8:15 AM Alvester Morin, Isa Rankin, MD [facility]    Calvert Cantor, CNM

## 2020-06-01 ENCOUNTER — Ambulatory Visit: Payer: BC Managed Care – PPO | Attending: Obstetrics and Gynecology

## 2020-06-01 ENCOUNTER — Ambulatory Visit: Payer: BC Managed Care – PPO

## 2020-06-01 ENCOUNTER — Other Ambulatory Visit: Payer: Self-pay

## 2020-06-01 DIAGNOSIS — Z363 Encounter for antenatal screening for malformations: Secondary | ICD-10-CM | POA: Diagnosis not present

## 2020-06-01 DIAGNOSIS — Z3A25 25 weeks gestation of pregnancy: Secondary | ICD-10-CM

## 2020-06-01 DIAGNOSIS — O358XX0 Maternal care for other (suspected) fetal abnormality and damage, not applicable or unspecified: Secondary | ICD-10-CM

## 2020-06-01 DIAGNOSIS — Z3689 Encounter for other specified antenatal screening: Secondary | ICD-10-CM | POA: Diagnosis not present

## 2020-07-01 ENCOUNTER — Encounter: Payer: Self-pay | Admitting: Family Medicine

## 2020-07-01 ENCOUNTER — Ambulatory Visit (INDEPENDENT_AMBULATORY_CARE_PROVIDER_SITE_OTHER): Payer: BC Managed Care – PPO | Admitting: Family Medicine

## 2020-07-01 ENCOUNTER — Other Ambulatory Visit: Payer: Self-pay

## 2020-07-01 VITALS — BP 104/71 | HR 73 | Wt 163.8 lb

## 2020-07-01 DIAGNOSIS — Z3480 Encounter for supervision of other normal pregnancy, unspecified trimester: Secondary | ICD-10-CM

## 2020-07-01 DIAGNOSIS — Z23 Encounter for immunization: Secondary | ICD-10-CM

## 2020-07-01 DIAGNOSIS — Z3403 Encounter for supervision of normal first pregnancy, third trimester: Secondary | ICD-10-CM

## 2020-07-01 DIAGNOSIS — Z3402 Encounter for supervision of normal first pregnancy, second trimester: Secondary | ICD-10-CM | POA: Diagnosis not present

## 2020-07-01 DIAGNOSIS — F419 Anxiety disorder, unspecified: Secondary | ICD-10-CM

## 2020-07-01 LAB — CBC
Hematocrit: 35.8 % (ref 34.0–46.6)
Hemoglobin: 12.5 g/dL (ref 11.1–15.9)
MCH: 32.5 pg (ref 26.6–33.0)
MCHC: 34.9 g/dL (ref 31.5–35.7)
MCV: 93 fL (ref 79–97)
Platelets: 173 10*3/uL (ref 150–450)
RBC: 3.85 x10E6/uL (ref 3.77–5.28)
RDW: 11.8 % (ref 11.7–15.4)
WBC: 7.3 10*3/uL (ref 3.4–10.8)

## 2020-07-01 NOTE — Progress Notes (Signed)
Covid Booster: 06/06/2020

## 2020-07-01 NOTE — Progress Notes (Signed)
   PRENATAL VISIT NOTE  Subjective:  Lacey Guerrero is a 29 y.o. G3P1011 at [redacted]w[redacted]d being seen today for ongoing prenatal care.  She is currently monitored for the following issues for this low-risk pregnancy and has Supervision of other normal pregnancy, antepartum; Medication exposure during first trimester of pregnancy; Umbilical hernia without obstruction and without gangrene; Acne; and Herpes simplex type 1 infection on their problem list.  Patient reports no complaints.  Contractions: Not present. Vag. Bleeding: None.  Movement: Present. Denies leaking of fluid.   The following portions of the patient's history were reviewed and updated as appropriate: allergies, current medications, past family history, past medical history, past social history, past surgical history and problem list.   Objective:   Vitals:   07/01/20 0834  BP: 104/71  Pulse: 73  Weight: 163 lb 12.8 oz (74.3 kg)    Fetal Status: Fetal Heart Rate (bpm): 154 Fundal Height: 29 cm Movement: Present     General:  Alert, oriented and cooperative. Patient is in no acute distress.  Skin: Skin is warm and dry. No rash noted.   Cardiovascular: Normal heart rate noted  Respiratory: Normal respiratory effort, no problems with respiration noted  Abdomen: Soft, gravid, appropriate for gestational age.  Pain/Pressure: Present     Pelvic: Cervical exam deferred        Extremities: Normal range of motion.  Edema: None  Mental Status: Normal mood and affect. Normal behavior. Normal judgment and thought content.   Assessment and Plan:  Pregnancy: G3P1011 at [redacted]w[redacted]d  1. Encounter for supervision of normal first pregnancy in third trimester - Glucose Tolerance, 2 Hours w/1 Hour - CBC - RPR - HIV Antibody (routine testing w rflx)  2. Anxiety Reports having anxiety, no SI/HI but notes increased stress with 29 yo. Find her self perseverating about small decisions and "worrying." We discussed starting counseling and possible  medication in the future if sx worsen. She is ammenable to [facility] referr today - Ambulatory referral to [facility]  3. Supervision of other normal pregnancy, antepartum Doing well  Desires LC visit to discuss breastfeeding after breast implants. Will schedule at next visit Reviewed available BF classes through [facility] Pumped x 1 yr with current 29 yo and fed at the breast for 6 months. Will have have patient come for [facility] specific discussion  Preterm labor symptoms and general obstetric precautions including but not limited to vaginal bleeding, contractions, leaking of fluid and fetal movement were reviewed in detail with the patient. Please refer to After Visit Summary for other counseling recommendations.   Return in about 2 weeks (around 07/15/2020) for Routine prenatal care.  Future Appointments  Date Time Provider Department Center  07/15/2020  2:30 PM Federico Flake, MD [facility]  07/29/2020  2:30 PM Federico Flake, MD [facility]  08/19/2020  2:30 PM Calvert Cantor, CNM [facility]  08/26/2020  2:30 PM Federico Flake, MD [facility]    Federico Flake, MD

## 2020-07-02 LAB — HIV ANTIBODY (ROUTINE TESTING W REFLEX): HIV Screen 4th Generation wRfx: NONREACTIVE

## 2020-07-02 LAB — GLUCOSE TOLERANCE, 2 HOURS W/ 1HR
Glucose, 1 hour: 113 mg/dL (ref 65–179)
Glucose, 2 hour: 88 mg/dL (ref 65–152)
Glucose, Fasting: 66 mg/dL (ref 65–91)

## 2020-07-02 LAB — RPR: RPR Ser Ql: NONREACTIVE

## 2020-07-15 ENCOUNTER — Other Ambulatory Visit: Payer: Self-pay

## 2020-07-15 ENCOUNTER — Ambulatory Visit (INDEPENDENT_AMBULATORY_CARE_PROVIDER_SITE_OTHER): Payer: BC Managed Care – PPO | Admitting: Family Medicine

## 2020-07-15 VITALS — BP 111/69 | HR 63 | Wt 167.0 lb

## 2020-07-15 DIAGNOSIS — B009 Herpesviral infection, unspecified: Secondary | ICD-10-CM

## 2020-07-15 DIAGNOSIS — Z9882 Breast implant status: Secondary | ICD-10-CM

## 2020-07-15 DIAGNOSIS — Z3A31 31 weeks gestation of pregnancy: Secondary | ICD-10-CM

## 2020-07-15 DIAGNOSIS — Z3483 Encounter for supervision of other normal pregnancy, third trimester: Secondary | ICD-10-CM

## 2020-07-15 DIAGNOSIS — Z3480 Encounter for supervision of other normal pregnancy, unspecified trimester: Secondary | ICD-10-CM

## 2020-07-15 DIAGNOSIS — K429 Umbilical hernia without obstruction or gangrene: Secondary | ICD-10-CM

## 2020-07-15 NOTE — Progress Notes (Signed)
   PRENATAL VISIT NOTE  Subjective:  Lacey Guerrero is a 29 y.o. G3P1011 at [redacted]w[redacted]d being seen today for ongoing prenatal care.  She is currently monitored for the following issues for this low-risk pregnancy and has Supervision of other normal pregnancy, antepartum; Medication exposure during first trimester of pregnancy; Umbilical hernia without obstruction and without gangrene; Acne; and Herpes simplex type 1 infection on their problem list.  Patient reports no complaints.  Contractions: Not present. Vag. Bleeding: None.  Movement: Present. Denies leaking of fluid.   The following portions of the patient's history were reviewed and updated as appropriate: allergies, current medications, past family history, past medical history, past social history, past surgical history and problem list.   Objective:   Vitals:   07/15/20 1443  BP: 111/69  Pulse: 63  Weight: 167 lb (75.8 kg)    Fetal Status: Fetal Heart Rate (bpm): 145   Movement: Present     General:  Alert, oriented and cooperative. Patient is in no acute distress.  Skin: Skin is warm and dry. No rash noted.   Cardiovascular: Normal heart rate noted  Respiratory: Normal respiratory effort, no problems with respiration noted  Abdomen: Soft, gravid, appropriate for gestational age.  Pain/Pressure: Present     Pelvic: Cervical exam deferred        Extremities: Normal range of motion.     Mental Status: Normal mood and affect. Normal behavior. Normal judgment and thought content.   Assessment and Plan:  Pregnancy: G3P1011 at [redacted]w[redacted]d 1. Supervision of other normal pregnancy, antepartum Up to date haivng pressure but appears normal, will monitor sx.   2. Umbilical hernia without obstruction and without gangrene Unchanged, no complaints  3. Herpes simplex type 1 infection  4. Hx of breast augmentation See lactation note.   Preterm labor symptoms and general obstetric precautions including but not limited to vaginal bleeding,  contractions, leaking of fluid and fetal movement were reviewed in detail with the patient. Please refer to After Visit Summary for other counseling recommendations.   Return in about 2 weeks (around 07/29/2020) for Routine prenatal care.  Future Appointments  Date Time Provider Department Center  07/29/2020  2:30 PM Federico Flake, MD [facility]  08/19/2020  2:30 PM Calvert Cantor, CNM [facility]  08/26/2020  2:30 PM Federico Flake, MD [facility]    Federico Flake, MD

## 2020-07-15 NOTE — Progress Notes (Signed)
   LACTATION CONSULTANT ASSESSMENT   S: Patient requested LC visit due to interval breast augmentation since last nursing/bm expression. Patient has breast implants, they were placed under the muscle. Surgery was in [place].  She BM fed her last child for 1 yr. They nursed at the breast for 6 months and then mother pumped and provided BM until 1 year.   O: BP 111/69   Pulse 63   Wt 167 lb (75.8 kg)   LMP 10/10/2019   BMI 26.95 kg/m    Breast: Symmetric bilaterally. Inferior scars consistent with below muscle placement.   R: able to see appropriately increased vasculature. Everted nipples. Glandular tissue palpated normally. L: able to see appropriately increased vasculature.Everted nipples. Glandular tissue palpated normally.  Assessment and Plan: # Breast Augmentation: Discussed her surgery and that it appears to have been a surgical type that preserves future breastmilk expression. Reassured that glandular tissues appears to be be recruiting blood supply evidenced by vasculature on breasts bilaterally. Counseled that it is hard to know how exactly surgery will effect lactation until after arrival of baby.  Mother does have extensive experience with pumping for last child.  - Recommend in hospital assessment by [facility] - Recommend outpatient IBCLC at 1 week to assess nursing dyad - Would benefit from weighted feed outpatient.  -Reviewed importance of draining breast every 2-3 hrs to help stimulate milk to come in.   [phone]

## 2020-07-21 DIAGNOSIS — Z3A32 32 weeks gestation of pregnancy: Secondary | ICD-10-CM | POA: Diagnosis not present

## 2020-07-21 DIAGNOSIS — Z20822 Contact with and (suspected) exposure to covid-19: Secondary | ICD-10-CM | POA: Diagnosis not present

## 2020-07-29 ENCOUNTER — Telehealth (INDEPENDENT_AMBULATORY_CARE_PROVIDER_SITE_OTHER): Payer: BC Managed Care – PPO | Admitting: Family Medicine

## 2020-07-29 ENCOUNTER — Other Ambulatory Visit: Payer: Self-pay

## 2020-07-29 DIAGNOSIS — Z3A33 33 weeks gestation of pregnancy: Secondary | ICD-10-CM

## 2020-07-29 DIAGNOSIS — Z3480 Encounter for supervision of other normal pregnancy, unspecified trimester: Secondary | ICD-10-CM

## 2020-07-29 DIAGNOSIS — Z3483 Encounter for supervision of other normal pregnancy, third trimester: Secondary | ICD-10-CM

## 2020-07-29 NOTE — Progress Notes (Signed)
I connected with Lacey Guerrero 07/29/20 at  2:30 PM EST by: MyChart video and verified that I am speaking with the correct person using two identifiers.  Patient is located at Home- address in epic and provider is located at [facility].     The purpose of this virtual visit is to provide medical care while limiting exposure to the novel coronavirus. I discussed the limitations, risks, security and privacy concerns of performing an evaluation and management service by MyChart video and the availability of in person appointments. I also discussed with the patient that there may be a patient responsible charge related to this service. By engaging in this virtual visit, you consent to the provision of healthcare.  Additionally, you authorize for your insurance to be billed for the services provided during this visit.  The patient expressed understanding and agreed to proceed.  The following staff members participated in the virtual visit:  Judie Bonus CMA    PRENATAL VISIT NOTE  Subjective:  Lacey Guerrero is a 29 y.o. G3P1011 at [redacted]w[redacted]d  for phone visit for ongoing prenatal care.  She is currently monitored for the following issues for this low-risk pregnancy and has Supervision of other normal pregnancy, antepartum; Medication exposure during first trimester of pregnancy; Umbilical hernia without obstruction and without gangrene; Acne; and Herpes simplex type 1 infection on their problem list.  Patient reports no complaints.  Contractions: Not present. Vag. Bleeding: None.  Movement: Present. Denies leaking of fluid.   The following portions of the patient's history were reviewed and updated as appropriate: allergies, current medications, past family history, past medical history, past social history, past surgical history and problem list.   Objective:  There were no vitals filed for this visit. Self-Obtained  Fetal Status:     Movement: Present     Assessment and Plan:  Pregnancy: G3P1011 at  [redacted]w[redacted]d  1. Supervision of other normal pregnancy, antepartum Up to date Changed to virtual due to lack of childcare Reassured that Valtrex safe in pregnancy Reviewed hand expression and starting at full term 38-39 wks Reviewed after hours care  Preterm labor symptoms and general obstetric precautions including but not limited to vaginal bleeding, contractions, leaking of fluid and fetal movement were reviewed in detail with the patient.  Return in about 2 weeks (around 08/12/2020) for Routine prenatal care, in person.  Future Appointments  Date Time Provider Department Center  08/19/2020  2:30 PM Calvert Cantor, [place] [facility]  08/26/2020  2:30 PM Federico Flake, MD [facility]     Time spent on virtual visit: 10 minutes  Federico Flake, MD

## 2020-08-01 NOTE — L&D Delivery Note (Signed)
Delivery Note About an hour after AROM, vtx was at +2 station.  After a 2 ctx 2nd stage, at 12:32 AM a viable female was delivered via Vaginal, Spontaneous (Presentation: Left Occiput Anterior).  APGAR: 9, 9; weight pending.  After 1 minute, the cord was clamped and cut. 40 units of pitocin diluted in 1000cc LR was infused rapidly IV.  The placenta separated spontaneously and delivered via CCT and maternal pushing effort.  It was inspected and appears to be intact with a 3 VC.    Anesthesia: Epidural Episiotomy: None Lacerations: 1st degree Suture Repair: 3.0 chromic Est. Blood Loss (mL): 478  Mom to postpartum.  Baby to Couplet care / Skin to Skin.  Scarlette Calico Cresenzo-Dishmon 08/25/2020, 1:11 AM

## 2020-08-14 ENCOUNTER — Encounter: Payer: Self-pay | Admitting: *Deleted

## 2020-08-19 ENCOUNTER — Other Ambulatory Visit: Payer: Self-pay

## 2020-08-19 ENCOUNTER — Ambulatory Visit (INDEPENDENT_AMBULATORY_CARE_PROVIDER_SITE_OTHER): Payer: BC Managed Care – PPO | Admitting: Advanced Practice Midwife

## 2020-08-19 ENCOUNTER — Other Ambulatory Visit (HOSPITAL_COMMUNITY)
Admission: RE | Admit: 2020-08-19 | Discharge: 2020-08-19 | Disposition: A | Discharge status: Home / Self Care | Payer: BC Managed Care – PPO | Source: Ambulatory Visit | Attending: Advanced Practice Midwife | Admitting: Advanced Practice Midwife

## 2020-08-19 VITALS — BP 122/80 | HR 58 | Wt 172.4 lb

## 2020-08-19 DIAGNOSIS — Z3480 Encounter for supervision of other normal pregnancy, unspecified trimester: Secondary | ICD-10-CM | POA: Diagnosis not present

## 2020-08-19 DIAGNOSIS — Z3A36 36 weeks gestation of pregnancy: Secondary | ICD-10-CM

## 2020-08-19 NOTE — Progress Notes (Signed)
   PRENATAL VISIT NOTE  Subjective:  Lacey Guerrero is a 30 y.o. G3P1011 at 101w1d being seen today for ongoing prenatal care.  She is currently monitored for the following issues for this low-risk pregnancy and has Supervision of other normal pregnancy, antepartum; Medication exposure during first trimester of pregnancy; Umbilical hernia without obstruction and without gangrene; Acne; and Herpes simplex type 1 infection on their problem list.  Patient reports no complaints.  Contractions: Not present. Vag. Bleeding: None.  Movement: Present. Denies leaking of fluid.   The following portions of the patient's history were reviewed and updated as appropriate: allergies, current medications, past family history, past medical history, past social history, past surgical history and problem list. Problem list updated.  Objective:   Vitals:   08/19/20 1454  BP: 122/80  Pulse: (!) 58  Weight: 172 lb 6.4 oz (78.2 kg)    Fetal Status: Fetal Heart Rate (bpm): 129 Fundal Height: 34 cm Movement: Present  Presentation: Vertex  General:  Alert, oriented and cooperative. Patient is in no acute distress.  Skin: Skin is warm and dry. No rash noted.   Cardiovascular: Normal heart rate noted  Respiratory: Normal respiratory effort, no problems with respiration noted  Abdomen: Soft, gravid, appropriate for gestational age.  Pain/Pressure: Present     Pelvic: Cervical exam performed Dilation: Closed Effacement (%): Thick Station: Ballotable  Extremities: Normal range of motion.     Mental Status: Normal mood and affect. Normal behavior. Normal judgment and thought content.   Assessment and Plan:  Pregnancy: G3P1011 at [redacted]w[redacted]d  1. Supervision of other normal pregnancy, antepartum - LOB, no acute concerns - Reviewed minimal impact of GBS positive result on labor course - Strep Gp B NAA - GC/Chlamydia probe amp ([place])not at [facility]  2. [redacted] weeks gestation of pregnancy   Preterm labor symptoms and  general obstetric precautions including but not limited to vaginal bleeding, contractions, leaking of fluid and fetal movement were reviewed in detail with the patient. Please refer to After Visit Summary for other counseling recommendations.    Future Appointments  Date Time Provider Department Center  08/26/2020  2:30 PM Federico Flake, MD [facility]    [place], [place]

## 2020-08-19 NOTE — Patient Instructions (Signed)

## 2020-08-21 LAB — STREP GP B NAA: Strep Gp B NAA: NEGATIVE

## 2020-08-21 LAB — GC/CHLAMYDIA PROBE AMP (~~LOC~~) NOT AT ARMC
Chlamydia: NEGATIVE
Comment: NEGATIVE
Comment: NORMAL
Neisseria Gonorrhea: NEGATIVE

## 2020-08-24 ENCOUNTER — Encounter (HOSPITAL_COMMUNITY): Payer: Self-pay | Admitting: Obstetrics and Gynecology

## 2020-08-24 ENCOUNTER — Inpatient Hospital Stay (HOSPITAL_COMMUNITY): Payer: BC Managed Care – PPO | Admitting: Anesthesiology

## 2020-08-24 ENCOUNTER — Other Ambulatory Visit: Payer: Self-pay

## 2020-08-24 ENCOUNTER — Inpatient Hospital Stay (HOSPITAL_COMMUNITY)
Admission: AD | Admit: 2020-08-24 | Discharge: 2020-08-26 | DRG: 807 | Disposition: A | Discharge status: Home / Self Care | Payer: BC Managed Care – PPO | Attending: Obstetrics and Gynecology | Admitting: Obstetrics and Gynecology

## 2020-08-24 ENCOUNTER — Ambulatory Visit (INDEPENDENT_AMBULATORY_CARE_PROVIDER_SITE_OTHER): Payer: BC Managed Care – PPO | Admitting: Obstetrics and Gynecology

## 2020-08-24 VITALS — BP 122/81 | HR 63 | Wt 175.0 lb

## 2020-08-24 DIAGNOSIS — Z8759 Personal history of other complications of pregnancy, childbirth and the puerperium: Secondary | ICD-10-CM

## 2020-08-24 DIAGNOSIS — O139 Gestational [pregnancy-induced] hypertension without significant proteinuria, unspecified trimester: Secondary | ICD-10-CM

## 2020-08-24 DIAGNOSIS — O479 False labor, unspecified: Secondary | ICD-10-CM

## 2020-08-24 DIAGNOSIS — Z20822 Contact with and (suspected) exposure to covid-19: Secondary | ICD-10-CM | POA: Diagnosis not present

## 2020-08-24 DIAGNOSIS — O26893 Other specified pregnancy related conditions, third trimester: Secondary | ICD-10-CM | POA: Diagnosis not present

## 2020-08-24 DIAGNOSIS — Z3480 Encounter for supervision of other normal pregnancy, unspecified trimester: Secondary | ICD-10-CM

## 2020-08-24 DIAGNOSIS — Z880 Allergy status to penicillin: Secondary | ICD-10-CM

## 2020-08-24 DIAGNOSIS — Z3A36 36 weeks gestation of pregnancy: Secondary | ICD-10-CM

## 2020-08-24 DIAGNOSIS — Z9882 Breast implant status: Secondary | ICD-10-CM

## 2020-08-24 DIAGNOSIS — O134 Gestational [pregnancy-induced] hypertension without significant proteinuria, complicating childbirth: Secondary | ICD-10-CM | POA: Diagnosis not present

## 2020-08-24 DIAGNOSIS — Z3A37 37 weeks gestation of pregnancy: Secondary | ICD-10-CM | POA: Diagnosis not present

## 2020-08-24 LAB — SARS CORONAVIRUS 2 BY RT PCR (HOSPITAL ORDER, PERFORMED IN ~~LOC~~ HOSPITAL LAB): SARS Coronavirus 2: NEGATIVE

## 2020-08-24 LAB — CBC
HCT: 37.1 % (ref 36.0–46.0)
Hemoglobin: 12.5 g/dL (ref 12.0–15.0)
MCH: 32.1 pg (ref 26.0–34.0)
MCHC: 33.7 g/dL (ref 30.0–36.0)
MCV: 95.1 fL (ref 80.0–100.0)
Platelets: 149 10*3/uL — ABNORMAL LOW (ref 150–400)
RBC: 3.9 MIL/uL (ref 3.87–5.11)
RDW: 11.9 % (ref 11.5–15.5)
WBC: 10.5 10*3/uL (ref 4.0–10.5)
nRBC: 0 % (ref 0.0–0.2)

## 2020-08-24 LAB — TYPE AND SCREEN
ABO/RH(D): A POS
Antibody Screen: NEGATIVE

## 2020-08-24 MED ORDER — OXYCODONE-ACETAMINOPHEN 5-325 MG PO TABS: 2.0000 | ORAL_TABLET | ORAL | Status: DC | PRN

## 2020-08-24 MED ORDER — ONDANSETRON HCL 4 MG/2ML IJ SOLN: 4.0000 mg | Freq: Four times a day (QID) | INTRAMUSCULAR | Status: DC | PRN

## 2020-08-24 MED ORDER — FLEET ENEMA 7-19 GM/118ML RE ENEM: 1.0000 | ENEMA | RECTAL | Status: DC | PRN

## 2020-08-24 MED ORDER — LACTATED RINGERS IV SOLN
500.0000 mL | Freq: Once | INTRAVENOUS | Status: AC
  Administered 2020-08-24: 500 mL via INTRAVENOUS

## 2020-08-24 MED ORDER — SOD CITRATE-CITRIC ACID 500-334 MG/5ML PO SOLN: 30.0000 mL | ORAL | Status: DC | PRN

## 2020-08-24 MED ORDER — PHENYLEPHRINE 40 MCG/ML (10ML) SYRINGE FOR IV PUSH (FOR BLOOD PRESSURE SUPPORT): 80.0000 ug | PREFILLED_SYRINGE | INTRAVENOUS | Status: DC | PRN

## 2020-08-24 MED ORDER — LACTATED RINGERS IV SOLN
INTRAVENOUS | Status: DC
Start: 2020-08-24 — End: 2020-08-25

## 2020-08-24 MED ORDER — FENTANYL-BUPIVACAINE-NACL 0.5-0.125-0.9 MG/250ML-% EP SOLN
12.0000 mL/h | EPIDURAL | Status: DC | PRN
  Filled 2020-08-24: qty 250

## 2020-08-24 MED ORDER — EPHEDRINE 5 MG/ML INJ: 10.0000 mg | INTRAVENOUS | Status: DC | PRN

## 2020-08-24 MED ORDER — FENTANYL CITRATE (PF) 100 MCG/2ML IJ SOLN: 50.0000 ug | INTRAMUSCULAR | Status: DC | PRN

## 2020-08-24 MED ORDER — ACETAMINOPHEN 325 MG PO TABS: 650.0000 mg | ORAL_TABLET | ORAL | Status: DC | PRN

## 2020-08-24 MED ORDER — LIDOCAINE HCL (PF) 1 % IJ SOLN: 30.0000 mL | INTRAMUSCULAR | Status: DC | PRN

## 2020-08-24 MED ORDER — SODIUM CHLORIDE (PF) 0.9 % IJ SOLN
INTRAMUSCULAR | Status: DC | PRN
  Administered 2020-08-24: 12 mL/h via EPIDURAL

## 2020-08-24 MED ORDER — HYDROXYZINE HCL 50 MG PO TABS: 50.0000 mg | ORAL_TABLET | Freq: Four times a day (QID) | ORAL | Status: DC | PRN

## 2020-08-24 MED ORDER — OXYTOCIN-SODIUM CHLORIDE 30-0.9 UT/500ML-% IV SOLN
2.5000 [IU]/h | INTRAVENOUS | Status: DC
  Administered 2020-08-25: 2.5 [IU]/h via INTRAVENOUS
  Filled 2020-08-24: qty 500

## 2020-08-24 MED ORDER — LIDOCAINE HCL (PF) 1 % IJ SOLN
INTRAMUSCULAR | Status: DC | PRN
  Administered 2020-08-24: 10 mL via EPIDURAL
  Administered 2020-08-24: 2 mL via EPIDURAL

## 2020-08-24 MED ORDER — DIPHENHYDRAMINE HCL 50 MG/ML IJ SOLN: 12.5000 mg | INTRAMUSCULAR | Status: DC | PRN

## 2020-08-24 MED ORDER — LACTATED RINGERS IV SOLN: 500.0000 mL | INTRAVENOUS | Status: DC | PRN

## 2020-08-24 MED ORDER — OXYCODONE-ACETAMINOPHEN 5-325 MG PO TABS: 1.0000 | ORAL_TABLET | ORAL | Status: DC | PRN

## 2020-08-24 MED ORDER — OXYTOCIN BOLUS FROM INFUSION
333.0000 mL | Freq: Once | INTRAVENOUS | Status: AC
  Administered 2020-08-25: 333 mL via INTRAVENOUS

## 2020-08-24 NOTE — Discharge Summary (Signed)
Postpartum Discharge Summary  Date of Service updated 08/26/20     Patient Name: Lacey Guerrero DOB: January 18, 1991 MRN: [id number]  Date of admission: 08/24/2020 Delivery date:08/25/2020  Delivering provider: Christin Fudge  Date of discharge: 08/26/2020  Admitting diagnosis: Indication for care in labor and delivery, antepartum [O75.9] Intrauterine pregnancy: [redacted]w[redacted]d    Secondary diagnosis:  Active Problems:   Indication for care in labor and delivery, antepartum   Gestational hypertension  Additional problems: GHTN dx in labor    Discharge diagnosis: Term Pregnancy Delivered                                              Post partum procedures:none Augmentation: AROM Complications: None  Hospital course: Onset of Labor With Vaginal Delivery      30y.o. yo G3P1011 at 309w6das admitted in Active Labor on 08/24/2020. Patient had an uncomplicated labor course as follows:  Membrane Rupture Time/Date: 11:36 PM ,08/24/2020   Delivery Method:Vaginal, Spontaneous  Episiotomy: None  Lacerations:  1st degree  Patient had an uncomplicated postpartum course.  She is ambulating, tolerating a regular diet, passing flatus, and urinating well. Patient is discharged home in stable condition on 08/26/20.  Newborn Data: Birth date:08/25/2020  Birth time:12:32 AM  Gender:Female  Living status:Living  Apgars:9 ,9  Weight:3110 g   Magnesium Sulfate received: No BMZ received: No Rhophylac:N/A MMR:N/A T-DaP:Given prenatally Flu: Yes Transfusion:No  Physical exam  Vitals:   08/25/20 1249 08/25/20 1650 08/25/20 2114 08/26/20 0605  BP: 116/76 120/79 127/76 131/88  Pulse: (!) 59 63 (!) 58 (!) 54  Resp: _0 Temp: 98.1 F (36.7 C) 98.4 F (36.9 C) 98.7 F (37.1 C) 97.9 F (36.6 C)  TempSrc: Oral Oral Oral Oral  SpO2: 98% 100% 100%   Weight:      Height:       General: alert, cooperative and no distress Lochia: appropriate Uterine Fundus: firm Incision: N/A DVT  Evaluation: No evidence of DVT seen on physical exam. Negative Homan's sign. No cords or calf tenderness. No significant calf/ankle edema. Labs: Lab Results  Component Value Date   WBC 10.5 08/24/2020   HGB 12.5 08/24/2020   HCT 37.1 08/24/2020   MCV 95.1 08/24/2020   PLT 149 (L) 08/24/2020   CMP Latest Ref Rng & Units 08/24/2020  Glucose 70 - 99 mg/dL 76  BUN 6 - 20 mg/dL 13  Creatinine 0.44 - 1.00 mg/dL 0.74  Sodium 135 - 145 mmol/L 133(L)  Potassium 3.5 - 5.1 mmol/L 4.0  Chloride 98 - 111 mmol/L 104  CO2 22 - 32 mmol/L 18(L)  Calcium 8.9 - 10.3 mg/dL 9.1  Total Protein 6.5 - 8.1 g/dL 6.0(L)  Total Bilirubin 0.3 - 1.2 mg/dL 0.6  Alkaline Phos 38 - 126 U/L 115  AST 15 - 41 U/L 18  ALT 0 - 44 U/L 15   Edinburgh Score: Edinburgh Postnatal Depression Scale Screening Tool 08/26/2020  I have been able to laugh and see the funny side of things. 0  I have looked forward with enjoyment to things. 0  I have blamed myself unnecessarily when things went wrong. 1  I have been anxious or worried for no good reason. 1  I have felt scared or panicky for no good reason. 2  Things have been getting on top of me. 1  I have been so unhappy that I have had difficulty sleeping. 0  °I have felt sad or miserable. 0  °I have been so unhappy that I have been crying. 0  °The thought of harming myself has occurred to me. 0  °Edinburgh Postnatal Depression Scale Total 5  ° ° ° °After visit meds:  °Allergies as of 08/26/2020   °No Known Allergies °  °  °Medication List  °  °STOP taking these medications   °acetaminophen 500 MG tablet °Commonly known as: TYLENOL °  °Bonjesta 20-20 MG Tbcr °Generic drug: Doxylamine-Pyridoxine ER °  °calcium carbonate 500 MG chewable tablet °Commonly known as: TUMS - dosed in mg elemental calcium °  °doxylamine (Sleep) 25 MG tablet °Commonly known as: UNISOM °  °valACYclovir 500 MG tablet °Commonly known as: VALTREX °  °  °TAKE these medications   °amLODipine 5 MG tablet °Commonly  known as: Norvasc °Take 1 tablet (5 mg total) by mouth daily. °  °ibuprofen 600 MG tablet °Commonly known as: ADVIL °Take 1 tablet (600 mg total) by mouth every 6 (six) hours as needed for mild pain, moderate pain or cramping. °  °prenatal multivitamin Tabs tablet °Take 1 tablet by mouth daily at 12 noon. °  °VITAMIN D PO °Take 1 tablet by mouth daily. °  °  ° ° ° °Discharge home in stable condition °Infant Feeding: Bottle and Breast °Infant Disposition:home with mother °Discharge instruction: per After Visit Summary and Postpartum booklet. °Activity: Advance as tolerated. Pelvic rest for 6 weeks.  °Diet: routine diet °Future Appointments: °Future Appointments  °Date Time Provider Department Center  °09/01/2020 10:00 AM [facility] NURSE [facility]  °10/07/2020 10:45 AM Newton, Kimberly Niles, MD [facility]  ° °Follow up Visit: ° Follow-up Information   ° [facility] Follow up.   °Specialty: Obstetrics and Gynecology °Why: as scheduled °Contact information: °[address] °[place] [address] °[phone] ° °  °  °  °  °  ° ° ° °Please schedule this patient for a In person postpartum visit in 6 weeks with the following provider: Any provider. °Additional Postpartum F/U: 1 week BP check (virtual) °Low risk pregnancy complicated by: GHTN in labor °Delivery mode:  Vaginal, Spontaneous  °Anticipated Birth Control:  IUD ° ° °08/26/2020 °Kimberly R Booker, CNM ° ° °

## 2020-08-24 NOTE — Anesthesia Procedure Notes (Signed)
Epidural Patient location during procedure: OB Start time: 08/24/2020 10:09 PM End time: 08/24/2020 10:19 PM  Staffing Anesthesiologist: Lannie Fields, DO Performed: anesthesiologist   Preanesthetic Checklist Completed: patient identified, IV checked, risks and benefits discussed, monitors and equipment checked, pre-op evaluation and timeout performed  Epidural Patient position: sitting Prep: DuraPrep and site prepped and draped Patient monitoring: continuous pulse ox, blood pressure, heart rate and cardiac monitor Approach: midline Location: L3-L4 Injection technique: LOR air  Needle:  Needle type: Tuohy  Needle gauge: 17 G Needle length: 9 cm Needle insertion depth: 6 cm Catheter type: closed end flexible Catheter size: 19 Gauge Catheter at skin depth: 11 cm Test dose: negative  Assessment Sensory level: T8 Events: blood not aspirated, injection not painful, no injection resistance, no paresthesia and negative IV test  Additional Notes Patient identified. Risks/Benefits/Options discussed with patient including but not limited to bleeding, infection, nerve damage, paralysis, failed block, incomplete pain control, headache, blood pressure changes, nausea, vomiting, reactions to medication both or allergic, itching and postpartum back pain. Confirmed with bedside nurse the patient's most recent platelet count. Confirmed with patient that they are not currently taking any anticoagulation, have any bleeding history or any family history of bleeding disorders. Patient expressed understanding and wished to proceed. All questions were answered. Sterile technique was used throughout the entire procedure. Please see nursing notes for vital signs. Test dose was given through epidural catheter and negative prior to continuing to dose epidural or start infusion. Warning signs of high block given to the patient including shortness of breath, tingling/numbness in hands, complete motor  block, or any concerning symptoms with instructions to call for help. Patient was given instructions on fall risk and not to get out of bed. All questions and concerns addressed with instructions to call with any issues or inadequate analgesia.  Reason for block:procedure for pain

## 2020-08-24 NOTE — MAU Note (Signed)
..  Lacey Guerrero is a 30 y.o. at [redacted]w[redacted]d here in MAU reporting: CTX since last night. Patient was in the office today and was 1 cm. Denies vaginal bleeding or leaking of fluid. +FM.

## 2020-08-24 NOTE — Progress Notes (Signed)
Prenatal Visit Note Date: 08/24/2020 Clinic: [facility]  Subjective:  Lacey Guerrero is a 30 y.o. G3P1011 at [redacted]w[redacted]d being seen today for ongoing prenatal care.  She is currently monitored for the following issues for this low-risk pregnancy and has Supervision of other normal pregnancy, antepartum; Medication exposure during first trimester of pregnancy; Umbilical hernia without obstruction and without gangrene; Acne; and Herpes simplex type 1 infection on their problem list.  Patient reports UCs. started last night at about q59m. currently q96m.   Contractions: Regular. Vag. Bleeding: None.  Movement: Present. Denies leaking of fluid.   The following portions of the patient's history were reviewed and updated as appropriate: allergies, current medications, past family history, past medical history, past social history, past surgical history and problem list. Problem list updated.  Objective:   Vitals:   08/24/20 1326  BP: 122/81  Pulse: 63  Weight: 175 lb (79.4 kg)    Fetal Status: Fetal Heart Rate (bpm): 135 Fundal Height: 36 cm Movement: Present  Presentation: Vertex  General:  Alert, oriented and cooperative. Patient is in no acute distress.  Skin: Skin is warm and dry. No rash noted.   Cardiovascular: Normal heart rate noted  Respiratory: Normal respiratory effort, no problems with respiration noted  Abdomen: Soft, gravid, appropriate for gestational age. Pain/Pressure: Present     Pelvic:  Cervical exam performed Dilation: 1 Effacement (%): Thick Station: Ballotable  Extremities: Normal range of motion.     Mental Status: Normal mood and affect. Normal behavior. Normal judgment and thought content.   Urinalysis:      Assessment and Plan:  Pregnancy: G3P1011 at [redacted]w[redacted]d  1. History of oligohydramnios Wondering about if this child could have low fluid. afi 7 at term last pregnancy per records. Will get growth u/s - [place] [facility] OB FOLLOW UP; Future  2. Uterine  contractions Lasted about 60s in the room, palpated mild to moderate. I gave her labor precautions  3. [redacted] weeks gestation of pregnancy gbs neg  Preterm labor symptoms and general obstetric precautions including but not limited to vaginal bleeding, contractions, leaking of fluid and fetal movement were reviewed in detail with the patient. Please refer to After Visit Summary for other counseling recommendations.  Return in about 1 week (around 08/31/2020), or 7-10d low risk. in person of virtual., for md or app.   Edgemere Bing, MD

## 2020-08-24 NOTE — Anesthesia Preprocedure Evaluation (Signed)
Anesthesia Evaluation  Patient identified by MRN, date of birth, ID band Patient awake    Reviewed: Allergy & Precautions, Patient's Chart, lab work & pertinent test results  Airway Mallampati: II  TM Distance: >3 FB Neck ROM: Full    Dental no notable dental hx.    Pulmonary neg pulmonary ROS,    Pulmonary exam normal breath sounds clear to auscultation       Cardiovascular negative cardio ROS Normal cardiovascular exam Rhythm:Regular Rate:Normal     Neuro/Psych negative neurological ROS  negative psych ROS   GI/Hepatic negative GI ROS, Neg liver ROS,   Endo/Other  negative endocrine ROS  Renal/GU negative Renal ROS  negative genitourinary   Musculoskeletal negative musculoskeletal ROS (+)   Abdominal   Peds  Hematology hct 37.1, plt 149   Anesthesia Other Findings   Reproductive/Obstetrics (+) Pregnancy                             Anesthesia Physical Anesthesia Plan  ASA: II and emergent  Anesthesia Plan: Epidural   Post-op Pain Management:    Induction:   PONV Risk Score and Plan: 2  Airway Management Planned: Natural Airway  Additional Equipment: None  Intra-op Plan:   Post-operative Plan:   Informed Consent: I have reviewed the patients History and Physical, chart, labs and discussed the procedure including the risks, benefits and alternatives for the proposed anesthesia with the patient or authorized representative who has indicated his/her understanding and acceptance.       Plan Discussed with:   Anesthesia Plan Comments:         Anesthesia Quick Evaluation

## 2020-08-24 NOTE — H&P (Signed)
Lacey Guerrero is a 30 y.o. female G3P1011 with IUP at [redacted]w[redacted]d by LMP/US presenting for contractions that started around 1500, now 4 minutes apart and very strong. .  She reports positive fetal movement. She denies leakage of fluid or vaginal bleeding.  Prenatal History/Complications: PNC at [facility] Pregnancy complications:  - Past Medical History: Past Medical History:  Diagnosis Date  . Medical history non-contributory     Past Surgical History: Past Surgical History:  Procedure Laterality Date  . PLACEMENT OF BREAST IMPLANTS Bilateral 2020  . WISDOM TOOTH EXTRACTION      Obstetrical History: OB History    Gravida  3   Para  1   Term  1   Preterm      AB  1   Living  1     SAB  1   IAB      Ectopic      Multiple      Live Births  1            Social History: Social History   Socioeconomic History  . Marital status: Married    Spouse name: Not on file  . Number of children: Not on file  . Years of education: Not on file  . Highest education level: Not on file  Occupational History  . Not on file  Tobacco Use  . Smoking status: Never Smoker  . Smokeless tobacco: Never Used  Vaping Use  . Vaping Use: Never used  Substance and Sexual Activity  . Alcohol use: Never  . Drug use: Never  . Sexual activity: Not on file  Other Topics Concern  . Not on file  Social History Narrative  . Not on file   Social Determinants of Health   Financial Resource Strain: Not on file  Food Insecurity: Not on file  Transportation Needs: Not on file  Physical Activity: Not on file  Stress: Not on file  Social Connections: Not on file    Family History: No family history on file.  Allergies: No Known Allergies  Medications Prior to Admission  Medication Sig Dispense Refill Last Dose  . doxylamine, Sleep, (UNISOM) 25 MG tablet Take 25 mg by mouth at bedtime as needed.     . Doxylamine-Pyridoxine ER (BONJESTA) 20-20 MG TBCR Take 1 tablet by mouth  daily. 1 tab po qhs. On Day 2, if symptoms aren't adequately controlled, the dose can be increased to 1 tab in AM & 1 tab qhs. The max recommended dose is two tabs daily: 1 in AM, 1 qhs (Patient not taking: Reported on 03/16/2020) 90 tablet 1   . Prenatal Vit-Fe Fumarate-FA (PRENATAL MULTIVITAMIN) TABS tablet Take 1 tablet by mouth daily at 12 noon.     . valACYclovir (VALTREX) 500 MG tablet Take 500 mg by mouth daily.     Marland Kitchen VITAMIN D PO Take by mouth.       Review of Systems   Constitutional: Negative for fever and chills Eyes: Negative for visual disturbances Respiratory: Negative for shortness of breath, dyspnea Cardiovascular: Negative for chest pain or palpitations  Gastrointestinal: Negative for vomiting, diarrhea and constipation.  POSITIVE for abdominal pain (contractions) Genitourinary: Negative for dysuria and urgency Musculoskeletal: Negative for back pain, joint pain, myalgias  Neurological: Negative for dizziness and headaches  Temperature 98.7 F (37.1 C), temperature source Oral, resp. rate 16, weight 80 kg, last menstrual period 10/10/2019, SpO2 100 %. General appearance: alert, cooperative and no distress Lungs: normal respiratory effort Heart: regular  rate and rhythm Abdomen: soft, non-tender; bowel sounds normal Extremities: Homans sign is negative, no sign of DVT DTR's 2+ Presentation: cephalic Fetal monitoring  Baseline: 140 bpm, Variability: Good {> 6 bpm), Accelerations: Reactive and Decelerations: Absent Uterine activity  2-3 minutes     Nursing Staff Provider  Office Location  [facility] Dating  9wk u/s  Language  English  Anatomy US  WNL  Flu Vaccine  05/27/20 Genetic Screen  NT u/s: neg NIPS: low risk  AFP:  neg   TDaP vaccine   07/01/2020 Hgb A1C or  GTT Early: n/a Third trimester   Rhogam  n/a   LAB RESULTS     Blood Type A/Positive/-- (07/20 1433) A pos  Feeding Plan Breast/bottle  Antibody Negative (07/20 1433)neg  Contraception Paraguard Rubella  1.52 (07/20 1433)imm  Circumcision Desires inpt RPR Non Reactive (07/20 1433) neg  Pediatrician  [facility] HBsAg Negative (07/20 1433) neg  Support Person Husband HCVAb Negative  Prenatal Classes  HIV Non Reactive (07/20 1433)     BTL Consent  GBS  (For PCN allergy, check sensitivities)   VBAC Consent  Pap  neg 2019    Hgb Electro    BP Cuff  CF     SMA     Waterbirth  [ ]  Class [ ]  Consent [ ]  CNM visit    Induction  [ ]  Orders Entered [ ] Foley Y/N   Prenatal labs: ABO, Rh: A/Positive/-- (07/20 1433) Antibody: Negative (07/20 1433) Rubella: 1.52 (07/20 1433) RPR: Non Reactive (12/01 0944)  HBsAg: Negative (07/20 1433)  HIV: Non Reactive (12/01 0946)  GBS: Negative/-- (01/19 1517)    Prenatal Transfer Tool  Maternal Diabetes: No Genetic Screening: Normal Maternal Ultrasounds/Referrals: Normal Fetal Ultrasounds or other Referrals:  None Maternal Substance Abuse:  No Significant Maternal Medications:  None Significant Maternal Lab Results: Group B Strep negative  No results found for this or any previous visit (from the past 24 hour(s)).  Assessment: Lacey Guerrero is a 30 y.o. [phone] with an IUP at [redacted]w[redacted]d presenting for active labor  Plan: #Labor: expectant management #Pain:  Per request--plans epidural ASAP #FWB Cat 1   05-26-1998 08/24/2020, 9:18 PM

## 2020-08-25 ENCOUNTER — Encounter (HOSPITAL_COMMUNITY): Payer: Self-pay | Admitting: Obstetrics and Gynecology

## 2020-08-25 ENCOUNTER — Ambulatory Visit: Payer: BC Managed Care – PPO

## 2020-08-25 DIAGNOSIS — Z3A36 36 weeks gestation of pregnancy: Secondary | ICD-10-CM

## 2020-08-25 DIAGNOSIS — O139 Gestational [pregnancy-induced] hypertension without significant proteinuria, unspecified trimester: Secondary | ICD-10-CM

## 2020-08-25 LAB — COMPREHENSIVE METABOLIC PANEL
ALT: 15 U/L (ref 0–44)
AST: 18 U/L (ref 15–41)
Albumin: 3 g/dL — ABNORMAL LOW (ref 3.5–5.0)
Alkaline Phosphatase: 115 U/L (ref 38–126)
Anion gap: 11 (ref 5–15)
BUN: 13 mg/dL (ref 6–20)
CO2: 18 mmol/L — ABNORMAL LOW (ref 22–32)
Calcium: 9.1 mg/dL (ref 8.9–10.3)
Chloride: 104 mmol/L (ref 98–111)
Creatinine, Ser: 0.74 mg/dL (ref 0.44–1.00)
GFR, Estimated: >60 mL/min (ref >60)
Glucose, Bld: 76 mg/dL (ref 70–99)
Potassium: 4 mmol/L (ref 3.5–5.1)
Sodium: 133 mmol/L — ABNORMAL LOW (ref 135–145)
Total Bilirubin: 0.6 mg/dL (ref 0.3–1.2)
Total Protein: 6 g/dL — ABNORMAL LOW (ref 6.5–8.1)

## 2020-08-25 LAB — RPR: RPR Ser Ql: NONREACTIVE

## 2020-08-25 MED ORDER — ACETAMINOPHEN 325 MG PO TABS
650.0000 mg | ORAL_TABLET | ORAL | Status: DC | PRN
  Administered 2020-08-25 (×2): 650 mg via ORAL
  Filled 2020-08-25 (×3): qty 2

## 2020-08-25 MED ORDER — FERROUS SULFATE 325 (65 FE) MG PO TABS
325.0000 mg | ORAL_TABLET | ORAL | Status: DC
  Administered 2020-08-25: 325 mg via ORAL
  Filled 2020-08-25: qty 1

## 2020-08-25 MED ORDER — DIBUCAINE (PERIANAL) 1 % EX OINT: 1.0000 "application " | TOPICAL_OINTMENT | CUTANEOUS | Status: DC | PRN

## 2020-08-25 MED ORDER — WITCH HAZEL-GLYCERIN EX PADS: 1.0000 "application " | MEDICATED_PAD | CUTANEOUS | Status: DC | PRN

## 2020-08-25 MED ORDER — ONDANSETRON HCL 4 MG/2ML IJ SOLN: 4.0000 mg | INTRAMUSCULAR | Status: DC | PRN

## 2020-08-25 MED ORDER — BENZOCAINE-MENTHOL 20-0.5 % EX AERO
1.0000 "application " | INHALATION_SPRAY | CUTANEOUS | Status: DC | PRN
  Administered 2020-08-25 – 2020-08-26 (×2): 1 via TOPICAL
  Filled 2020-08-25: qty 56

## 2020-08-25 MED ORDER — MEASLES, MUMPS & RUBELLA VAC IJ SOLR
0.5000 mL | Freq: Once | INTRAMUSCULAR | Status: DC
Start: 2020-08-26 — End: 2020-08-26

## 2020-08-25 MED ORDER — BISACODYL 10 MG RE SUPP: 10.0000 mg | Freq: Every day | RECTAL | Status: DC | PRN

## 2020-08-25 MED ORDER — DOCUSATE SODIUM 100 MG PO CAPS
100.0000 mg | ORAL_CAPSULE | Freq: Two times a day (BID) | ORAL | Status: DC
  Administered 2020-08-26 (×2): 100 mg via ORAL
  Filled 2020-08-25 (×2): qty 1

## 2020-08-25 MED ORDER — SIMETHICONE 80 MG PO CHEW: 80.0000 mg | CHEWABLE_TABLET | ORAL | Status: DC | PRN

## 2020-08-25 MED ORDER — PRENATAL MULTIVITAMIN CH
1.0000 | ORAL_TABLET | Freq: Every day | ORAL | Status: DC
  Administered 2020-08-25 – 2020-08-26 (×2): 1 via ORAL
  Filled 2020-08-25 (×2): qty 1

## 2020-08-25 MED ORDER — DIPHENHYDRAMINE HCL 25 MG PO CAPS: 25.0000 mg | ORAL_CAPSULE | Freq: Four times a day (QID) | ORAL | Status: DC | PRN

## 2020-08-25 MED ORDER — METHYLERGONOVINE MALEATE 0.2 MG/ML IJ SOLN: 0.2000 mg | INTRAMUSCULAR | Status: DC | PRN

## 2020-08-25 MED ORDER — IBUPROFEN 600 MG PO TABS
600.0000 mg | ORAL_TABLET | Freq: Four times a day (QID) | ORAL | Status: DC
  Administered 2020-08-25 – 2020-08-26 (×6): 600 mg via ORAL
  Filled 2020-08-25 (×5): qty 1

## 2020-08-25 MED ORDER — COCONUT OIL OIL
1.0000 "application " | TOPICAL_OIL | Status: DC | PRN
  Administered 2020-08-25: 1 via TOPICAL

## 2020-08-25 MED ORDER — TETANUS-DIPHTH-ACELL PERTUSSIS 5-2.5-18.5 LF-MCG/0.5 IM SUSY
0.5000 mL | PREFILLED_SYRINGE | Freq: Once | INTRAMUSCULAR | Status: DC
Start: 2020-08-26 — End: 2020-08-26

## 2020-08-25 MED ORDER — ONDANSETRON HCL 4 MG PO TABS: 4.0000 mg | ORAL_TABLET | ORAL | Status: DC | PRN

## 2020-08-25 MED ORDER — MEDROXYPROGESTERONE ACETATE 150 MG/ML IM SUSP: 150.0000 mg | INTRAMUSCULAR | Status: DC | PRN

## 2020-08-25 MED ORDER — METHYLERGONOVINE MALEATE 0.2 MG PO TABS: 0.2000 mg | ORAL_TABLET | ORAL | Status: DC | PRN

## 2020-08-25 MED ORDER — FLEET ENEMA 7-19 GM/118ML RE ENEM: 1.0000 | ENEMA | Freq: Every day | RECTAL | Status: DC | PRN

## 2020-08-25 NOTE — Plan of Care (Signed)

## 2020-08-25 NOTE — Lactation Note (Signed)
This note was copied from a baby's chart. Lactation Consultation Note  Patient Name: Lacey Guerrero [id number] Date: 08/25/2020 Reason for consult: L&D Initial assessment;Early term 37-38.6wks Age:30 hours ( No Charge for [facility]) Per mom, she has DEBP at home. Per mom, she got breast implants in [place] 2 years ago but leaked in her pregnancy, she notice breast changes.  LC entered room, mom latched infant on her left breast using the football hold position, infant latched with depth swallows observed.  Mom taught back hand expression and infant was given 5 mls of colostrum by spoon. Mom is experienced at breastfeeding, per mom, she BF her 11 year old for 6 months and then pumped for one year.  Mom knows to BF infant according to primal cues: rooting, smacking, hands to mouth, etc As LC was leaving room, mom re-latched infant on her right breast. Mom knows she can call RN or LC services  on MBU if she has questions, concerns or need assistance with latching infant at the breast. LC briefly discussed LC services and will leave pamphlet in patient's room ( 415) on MBU.  Maternal Data Formula Feeding for Exclusion: No Has patient been taught Hand Expression?: Yes Does the patient have breastfeeding experience prior to this delivery?: Yes  Feeding Feeding Type: Breast Milk  LATCH Score Latch: Grasps breast easily, tongue down, lips flanged, rhythmical sucking.  Audible Swallowing: Spontaneous and intermittent  Type of Nipple: Everted at rest and after stimulation  Comfort (Breast/Nipple): Soft / non-tender  Hold (Positioning): Assistance needed to correctly position infant at breast and maintain latch.  LATCH Score: 9  Interventions Interventions: Breast feeding basics reviewed;Assisted with latch;Skin to skin;Breast massage;Support pillows;Adjust position  Lactation Tools Discussed/Used WIC Program: No   Consult Status Consult Status: Follow-up Date: 08/25/20 Follow-up  type: In-patient    Danelle Earthly 08/25/2020, 1:44 AM

## 2020-08-25 NOTE — Progress Notes (Signed)
Patient Vitals for the past 4 hrs:  BP Temp Temp src Pulse Resp SpO2 Height Weight  08/24/20 2330 (!) 132/102 - - (!) 57 - - - -  08/24/20 2301 139/86 - - (!) 57 - - - -  08/24/20 2251 134/87 - - (!) 49 - - - -  08/24/20 2246 135/83 - - (!) 57 - - - -  08/24/20 2245 - - - - - 100 % - -  08/24/20 2241 (!) 150/90 - - (!) 51 - - - -  08/24/20 2240 - - - - - 100 % - -  08/24/20 2236 (!) 145/85 - - (!) 52 - - - -  08/24/20 2235 - - - - - 100 % - -  08/24/20 2231 (!) 146/89 - - 69 - - - -  08/24/20 2230 - - - - - 99 % - -  08/24/20 2227 138/85 - - (!) 52 - 99 % - -  08/24/20 2222 (!) 156/79 - - 62 - 100 % - -  08/24/20 2219 (!) 159/95 - - 66 - - - -  08/24/20 2217 - - - - - 97 % - -  08/24/20 2215 - - - - - 97 % - -  08/24/20 2212 - - - - - 97 % - -  08/24/20 2139 - - - - - - 5\' 6"  (1.676 m) 80 kg  08/24/20 2135 (!) 146/92 97.7 F (36.5 C) Axillary (!) 56 16 - - -  08/24/20 2054 - 98.7 F (37.1 C) Oral - 16 100 % - 80 kg   Comfortable w/epidural.  No pressure.  AROM w/clear fluid at 2330.  cx small rim/100/0 station.  Ctx q 1-2 minutes.  Anticipate SVD soon. Will check CMP as it seems she has GHTN

## 2020-08-25 NOTE — Anesthesia Postprocedure Evaluation (Signed)
Anesthesia Post Note  Patient: Lacey Guerrero  Procedure(s) Performed: AN AD HOC LABOR EPIDURAL     Patient location during evaluation: Mother Baby Anesthesia Type: Epidural Level of consciousness: awake and alert and oriented Pain management: satisfactory to patient Vital Signs Assessment: post-procedure vital signs reviewed and stable Respiratory status: spontaneous breathing and nonlabored ventilation Cardiovascular status: stable Postop Assessment: no headache, no backache, no signs of nausea or vomiting, adequate PO intake, patient able to bend at knees and able to ambulate (patient up walking) Anesthetic complications: no   No complications documented.  Last Vitals:  Vitals:   08/25/20 0255 08/25/20 0402  BP: 121/76 112/74  Pulse: 62 63  Resp: 16 16  Temp: 36.6 C 36.7 C  SpO2: 100% 99%    Last Pain:  Vitals:   08/25/20 0402  TempSrc: Oral  PainSc: 0-No pain   Pain Goal:                   [name]

## 2020-08-25 NOTE — Lactation Note (Addendum)
This note was copied from a baby's chart. Lactation Consultation Note  Patient Name: Lacey Guerrero [id number] Date: 08/25/2020 Reason for consult: Follow-up assessment;Early term 37-38.6wks Age:30 hours  LC and LC Student walked in to P2 parents eating and baby at 17 hrs old, cradled in mom's lap. [facility] Student asked about the baby feedings and how long baby had been at the breast. She shared that baby had just fed at the breast for approximately 20 minutes on the right breast.     LC Student asked about voids and stools as well as breastfeeding history with first child. Mom mentioned that her nipples were sore. When she showed LCs and LC Students her breasts, her nipples were visibly red. Baby was awake, but seemed to have been sleepy.  LC Student recommended STS to help with milk supply and to help baby with temperature regulation. Mom decided to undress baby and bring him skin to skin; baby immediately became alert and started to approach the breast and attempted to latch on his own.  The first attempt to latch, baby was shallow and the nipple was compressed when latch was broken. LC assisted mom with pillows and blankets to get mom into a modified cradle; mom expressed that the latch felt a lot better. Baby had lips flanged and notable swallows were noticed. Baby fed at the breast for approx 15 minutes  LC Student put together the manual pump and had mom fitted for a 21 mm flange. Discussed pumping basics as well as storage and cleaning; LC recommended for mom to use pump for supplementation through cluster feeding. By the time baby got off the breast, baby was content and looking to suckle for comfort.  LC and LC Student recommended rest and to call lactation for additional assistance through the night as needed.    Maternal Data Formula Feeding for Exclusion: No Does the patient have breastfeeding experience prior to this delivery?: Yes  Feeding Feeding Type: Breast Fed  LATCH  Score Latch: Grasps breast easily, tongue down, lips flanged, rhythmical sucking.  Audible Swallowing: Spontaneous and intermittent  Type of Nipple: Everted at rest and after stimulation  Comfort (Breast/Nipple): Filling, red/small blisters or bruises, mild/mod discomfort  Hold (Positioning): Assistance needed to correctly position infant at breast and maintain latch.  LATCH Score: 8  Interventions Interventions: Assisted with latch;Skin to skin;Support pillows;Position options;Adjust position;Hand pump  Lactation Tools Discussed/Used Tools: Flanges;Pump;Coconut oil Flange Size: 21 Breast pump type: Manual Pump Education: Setup, frequency, and cleaning;Milk Storage Initiated by:: LC Lucynda Rosano Date initiated:: 08/25/20   Consult Status Consult Status: Follow-up Date: 08/26/20 Follow-up type: In-patient   Angelica Terrilee Croak - LC student 08/25/2020, 6:28 PM  Jaimie Redditt A Higuera Ancidey 08/25/2020, 7:04 PM

## 2020-08-25 NOTE — Lactation Note (Incomplete)
This note was copied from a baby's chart. Lactation Consultation Note  Patient Name: Lacey Guerrero Date: 08/25/2020 Reason for consult: Follow-up assessment;Early term 37-38.6wks Age:30 hours  Maternal Data Formula Feeding for Exclusion: No Does the patient have breastfeeding experience prior to this delivery?: Yes  Feeding Feeding Type: Breast Fed  LATCH Score Latch: Grasps breast easily, tongue down, lips flanged, rhythmical sucking.  Audible Swallowing: Spontaneous and intermittent  Type of Nipple: Everted at rest and after stimulation  Comfort (Breast/Nipple): Filling, red/small blisters or bruises, mild/mod discomfort  Hold (Positioning): Assistance needed to correctly position infant at breast and maintain latch.  LATCH Score: 8  Interventions Interventions: Assisted with latch;Skin to skin;Support pillows;Position options;Adjust position;Hand pump  Lactation Tools Discussed/Used Tools: Flanges;Pump;Coconut oil Flange Size: 21 Breast pump type: Manual Pump Education: Setup, frequency, and cleaning;Milk Storage Initiated by:: LC Linels Date initiated:: 08/25/20   Consult Status Consult Status: Follow-up Date: 08/26/20 Follow-up type: In-patient  LC and LC Student walked in to P2 parents eating and baby at 17 hrs old, cradled in mom's lap. [facility] Student asked about the baby feedings and how long baby had been at the breast. She shared that baby had just fed at the breast for approximately 20 minutes on the right breast.     LC Student asked about pees and poops as well as breastfeeding history with first child. Mom mentioned that her nipples were sore. When she showed LCs and LC Students her breasts, her nipples were visibly red. Baby was awake, but seemed to have been sleepy.  LC Student recommended STS to help with milk supply and to help baby with temperature regulation. Mom decided to undress baby and bring him skin to skin; baby immediately became  alert and started to approach the breast and attempted to latch on his own.  The first attempt to latch, baby was shallow and the nipple was compressed when latch was broken. LC assisted mom with pillows and blankets to get mom into a modified cradle; mom expressed that the latch felt a lot better. Baby had lips flanged and notable swallows were noticed. Baby fed at the breast for approx 15 minutes  LC Student put together the manual pump and had mom fitted for a 21 mm flange. Discussed pumping basics as well as storage and cleaning; LC recommended for mom to use pump for supplementation through cluster feeding. By the time baby got off the breast, baby was content and looking to suckle for comfort.  LC and LC Student recommended rest and to call lactation for additional assistance through the night as needed.    Angelica Knight 08/25/2020, 6:28 PM

## 2020-08-26 ENCOUNTER — Encounter: Payer: BC Managed Care – PPO | Admitting: Family Medicine

## 2020-08-26 MED ORDER — AMLODIPINE BESYLATE 5 MG PO TABS: 5.0000 mg | ORAL_TABLET | Freq: Every day | ORAL | 0 refills | Status: DC

## 2020-08-26 MED ORDER — IBUPROFEN 600 MG PO TABS
600.0000 mg | ORAL_TABLET | Freq: Four times a day (QID) | ORAL | 0 refills | Status: DC | PRN
Start: 2020-08-26 — End: 2022-08-12

## 2020-08-26 NOTE — Lactation Note (Signed)
This note was copied from a baby's chart. Lactation Consultation Note  Patient Name: Lacey Guerrero [id number] Date: 08/26/2020 Reason for consult: Follow-up assessment;Early term 37-38.6wks;Infant weight loss;Other (Comment) (4 % weight loss. P 2) bilateral implants and milk is in, ( full breast )  Age:30 years  Mom latching as LC entered the room right breast cross cradle without support.  Per  Mom initially some pinching and the better.  Dad added pillow support. Baby fed well for 22 mins and when mom released the baby the nipple well rounded.  Baby settled for 10 mins and awake/ rooting.  LC assisted on the left breast due to the sore nipple ( intact positioning strip).  LC recommended hand expressing or pre-pump if the areola isn't compressible like a sandwich to start to prevent further soreness.  Baby latched easily and initial discomfort per mom eased quickly and baby still feeding with increased swallows with breast compressions.  LC reviewed sore nipple and engorgement prevention and tx.  Mom has the coconut oil. LC provided comfort gels for after the feedings x 6 days, alterate with shells ( while awake )  and a dab or coconut oil. Mom mentioned she was given #21 flange and was told to start with that size. Per mom felt it was to tight and uncomfortable.  After assessing moms tissue - LC recommended increasing to the #24 F and if  She was having andy challenges with engorgement , she may need to increase to the #27 F ( LC provided ) .  Per mom has DEBP - Medela at home.  LC recommended since mom has bilateral implants its important to prevent engorgement.  LC provided the [facility] brochure with resource numbers, and web site for support group.  Mom expressed appreciation for [facility] assistance and education.     Maternal Data Has patient been taught Hand Expression?: Yes  Feeding Feeding Type: Breast Fed  LATCH Score Latch: Grasps breast easily, tongue down, lips flanged, rhythmical  sucking.  Audible Swallowing: Spontaneous and intermittent  Type of Nipple: Everted at rest and after stimulation  Comfort (Breast/Nipple): Filling, red/small blisters or bruises, mild/mod discomfort  Hold (Positioning): No assistance needed to correctly position infant at breast.  LATCH Score: 9  Interventions Interventions: Breast feeding basics reviewed;Assisted with latch;Skin to skin;Breast massage;Hand express;Reverse pressure;Breast compression;Adjust position;Support pillows;Position options;Shells;Coconut oil;Comfort gels;Hand pump  Lactation Tools Discussed/Used Tools: Shells;Pump;Flanges;Coconut oil;Comfort gels Flange Size: 24;27 (per mom the #21 is to snug) Shell Type: Inverted Breast pump type: Manual Pump Education: Milk Storage   Consult Status Consult Status: Complete Date: 08/26/20 Follow-up type: In-patient    Matilde Sprang Banesa Tristan 08/26/2020, 10:29 AM

## 2020-08-26 NOTE — Lactation Note (Signed)
This note was copied from a baby's chart. Lactation Consultation Note  Patient Name: Boy Elissa Grieshop [id number] Date: 08/26/2020 Reason for consult: Breast augmentation;Early term 37-38.6wks;Follow-up assessment;Infant weight loss;Other (Comment) (mom, and baby asleep. - will F/U when awake for D/C teaching) Age:30 hours  Maternal Data    Feeding    LATCH Score                   Interventions    Lactation Tools Discussed/Used     Consult Status Consult Status: Follow-up Date: 08/26/20 Follow-up type: In-patient    Matilde Sprang Akari Crysler 08/26/2020, 8:43 AM

## 2020-08-26 NOTE — Discharge Instructions (Signed)
Call the office or go to [facility] hospital for these signs of pre-eclampsia:  Severe headache that does not go away with Tylenol  Visual changes- seeing spots, double, blurred vision  Pain under your right breast or upper abdomen that does not go away with Tums or heartburn medicine  Nausea and/or vomiting  Severe swelling in your hands, feet, and face   NO SEX UNTIL AFTER YOU GET YOUR BIRTH CONTROL      Postpartum Care After Vaginal Delivery The following information offers guidance about how to care for yourself from the time you deliver your baby to 6-12 weeks after delivery (postpartum period). If you have problems or questions, contact your health care provider for more specific instructions. Follow these instructions at home: Vaginal bleeding  It is normal to have vaginal bleeding (lochia) after delivery. Wear a sanitary pad for bleeding and discharge. ? During the first week after delivery, the amount and appearance of lochia is often similar to a menstrual period. ? Over the next few weeks, it will gradually decrease to a dry, yellow-brown discharge. ? For most women, lochia stops completely by 4-6 weeks after delivery, but can vary.  Change your sanitary pads frequently. Watch for any changes in your flow, such as: ? A sudden increase in volume. ? A change in color. ? Large blood clots.  If you pass a blood clot from your vagina, save it and call your health care provider. Do not flush blood clots down the toilet before talking with your health care provider.  Do not use tampons or douches until your health care provider approves.  If you are not breastfeeding, your period should return 6-8 weeks after delivery. If you are feeding your baby breast milk only, your period may not return until you stop breastfeeding. Perineal care  Keep the area between the vagina and the anus (perineum) clean and dry. Use medicated pads and pain-relieving sprays and creams as  directed.  If you had a surgical cut in the perineum (episiotomy) or a tear, check the area for signs of infection until you are healed. Check for: ? More redness, swelling, or pain. ? Fluid or blood coming from the cut or tear. ? Warmth. ? Pus or a bad smell.  You may be given a squirt bottle to use instead of wiping to clean the perineum area after you use the bathroom. Pat the area gently to dry it.  To relieve pain caused by an episiotomy, a tear, or swollen veins in the anus (hemorrhoids), take a warm sitz bath 2-3 times a day. In a sitz bath, the warm water should only come up to your hips and cover your buttocks.   Breast care  In the first few days after delivery, your breasts may feel heavy, full, and uncomfortable (breast engorgement). Milk may also leak from your breasts. Ask your health care provider about ways to help relieve the discomfort.  If you are breastfeeding: ? Wear a bra that supports your breasts and fits well. Use breast pads to absorb milk that leaks. ? Keep your nipples clean and dry. Apply creams and ointments as told. ? You may have uterine contractions every time you breastfeed for up to several weeks after delivery. This helps your uterus return to its normal size. ? If you have any problems with breastfeeding, notify your health care provider or lactation consultant.  If you are not breastfeeding: ? Avoid touching your breasts. Do not squeeze out (express) milk. Doing this can  make your breasts produce more milk. ? Wear a good-fitting bra and use cold packs to help with swelling. Intimacy and sexuality  Ask your health care provider when you can engage in sexual activity. This may depend upon: ? Your risk of infection. ? How fast you are healing. ? Your comfort and desire to engage in sexual activity.  You are able to get pregnant after delivery, even if you have not had your period. Talk with your health care provider about methods of birth control  (contraception) or family planning if you desire future pregnancies. Medicines  Take over-the-counter and prescription medicines only as told by your health care provider.  Take an over-the-counter stool softener to help ease bowel movements as told by your health care provider.  If you were prescribed an antibiotic medicine, take it as told by your health care provider. Do not stop taking the antibiotic even if you start to feel better.  Review all previous and current prescriptions to check for possible transfer into breast milk. Activity  Gradually return to your normal activities as told by your health care provider.  Rest as much as possible. Nap while your baby is sleeping. Eating and drinking  Drink enough fluid to keep your urine pale yellow.  To help prevent or relieve constipation, eat high-fiber foods every day.  Choose healthy eating to support breastfeeding or weight loss goals.  Take your prenatal vitamins until your health care provider tells you to stop.   General tips/recommendations  Do not use any products that contain nicotine or tobacco. These products include cigarettes, chewing tobacco, and vaping devices, such as e-cigarettes. If you need help quitting, ask your health care provider.  Do not drink alcohol, especially if you are breastfeeding.  Do not take medications or drugs that are not prescribed to you, especially if you are breastfeeding.  Visit your health care provider for a postpartum checkup within the first 3-6 weeks after delivery.  Complete a comprehensive postpartum visit no later than 12 weeks after delivery.  Keep all follow-up visits for you and your baby. Contact a health care provider if:  You feel unusually sad or worried.  Your breasts become red, painful, or hard.  You have a fever or other signs of an infection.  You have bleeding that is soaking through one pad an hour or you have blood clots.  You have a severe headache  that doesn't go away or you have vision changes.  You have nausea and vomiting and are unable to eat or drink anything for 24 hours. Get help right away if:  You have chest pain or difficulty breathing.  You have sudden, severe leg pain.  You faint or have a seizure.  You have thoughts about hurting yourself or your baby. If you ever feel like you may hurt yourself or others, or have thoughts about taking your own life, get help right away. Go to your nearest emergency department or:  Call your local emergency services (911 in the [place]  The National Suicide Prevention Lifeline at 682-222-7669. This suicide crisis helpline is open 24 hours a day.  Text the Crisis Text Line at 8285695784 (in the [place] Summary  The period of time after you deliver your newborn up to 6-12 weeks after delivery is called the postpartum period.  Keep all follow-up visits for you and your baby.  Review all previous and current prescriptions to check for possible transfer into breast milk.  Contact a health care provider if  you feel unusually sad or worried during the postpartum period. This information is not intended to replace advice given to you by your health care provider. Make sure you discuss any questions you have with your health care provider. Document Revised: 04/02/2020 Document Reviewed: 04/02/2020 Elsevier Patient Education  2021 [employer] Inc[employer]   Breastfeeding  Choosing to breastfeed is one of the best decisions you can make for yourself and your baby. A change in hormones during pregnancy causes your breasts to make breast milk in your milk-producing glands. Hormones prevent breast milk from being released before your baby is born. They also prompt milk flow after birth. Once breastfeeding has begun, thoughts of your baby, as well as his or her sucking or crying, can stimulate the release of milk from your milk-producing glands. Benefits of breastfeeding Research shows that breastfeeding  offers many health benefits for infants and mothers. It also offers a cost-free and convenient way to feed your baby. For your baby  Your first milk (colostrum) helps your baby's digestive system to function better.  Special cells in your milk (antibodies) help your baby to fight off infections.  Breastfed babies are less likely to develop asthma, allergies, obesity, or type 2 diabetes. They are also at lower risk for sudden infant death syndrome (SIDS).  Nutrients in breast milk are better able to meet your baby's needs compared to infant formula.  Breast milk improves your baby's brain development. For you  Breastfeeding helps to create a very special bond between you and your baby.  Breastfeeding is convenient. Breast milk costs nothing and is always available at the correct temperature.  Breastfeeding helps to burn calories. It helps you to lose the weight that you gained during pregnancy.  Breastfeeding makes your uterus return faster to its size before pregnancy. It also slows bleeding (lochia) after you give birth.  Breastfeeding helps to lower your risk of developing type 2 diabetes, osteoporosis, rheumatoid arthritis, cardiovascular disease, and breast, ovarian, uterine, and endometrial cancer later in life. Breastfeeding basics Starting breastfeeding  Find a comfortable place to sit or lie down, with your neck and back well-supported.  Place a pillow or a rolled-up blanket under your baby to bring him or her to the level of your breast (if you are seated). Nursing pillows are specially designed to help support your arms and your baby while you breastfeed.  Make sure that your baby's tummy (abdomen) is facing your abdomen.  Gently massage your breast. With your fingertips, massage from the outer edges of your breast inward toward the nipple. This encourages milk flow. If your milk flows slowly, you may need to continue this action during the feeding.  Support your breast  with 4 fingers underneath and your thumb above your nipple (make the letter "C" with your hand). Make sure your fingers are well away from your nipple and your baby's mouth.  Stroke your baby's lips gently with your finger or nipple.  When your baby's mouth is open wide enough, quickly bring your baby to your breast, placing your entire nipple and as much of the areola as possible into your baby's mouth. The areola is the colored area around your nipple. ? More areola should be visible above your baby's upper lip than below the lower lip. ? Your baby's lips should be opened and extended outward (flanged) to ensure an adequate, comfortable latch. ? Your baby's tongue should be between his or her lower gum and your breast.  Make sure that your baby's mouth  is correctly positioned around your nipple (latched). Your baby's lips should create a seal on your breast and be turned out (everted).  It is common for your baby to suck about 2-3 minutes in order to start the flow of breast milk. Latching Teaching your baby how to latch onto your breast properly is very important. An improper latch can cause nipple pain, decreased milk supply, and poor weight gain in your baby. Also, if your baby is not latched onto your nipple properly, he or she may swallow some air during feeding. This can make your baby fussy. Burping your baby when you switch breasts during the feeding can help to get rid of the air. However, teaching your baby to latch on properly is still the best way to prevent fussiness from swallowing air while breastfeeding. Signs that your baby has successfully latched onto your nipple  Silent tugging or silent sucking, without causing you pain. Infant's lips should be extended outward (flanged).  Swallowing heard between every 3-4 sucks once your milk has started to flow (after your let-down milk reflex occurs).  Muscle movement above and in front of his or her ears while sucking. Signs that your  baby has not successfully latched onto your nipple  Sucking sounds or smacking sounds from your baby while breastfeeding.  Nipple pain. If you think your baby has not latched on correctly, slip your finger into the corner of your baby's mouth to break the suction and place it between your baby's gums. Attempt to start breastfeeding again. Signs of successful breastfeeding Signs from your baby  Your baby will gradually decrease the number of sucks or will completely stop sucking.  Your baby will fall asleep.  Your baby's body will relax.  Your baby will retain a small amount of milk in his or her mouth.  Your baby will let go of your breast by himself or herself. Signs from you  Breasts that have increased in firmness, weight, and size 1-3 hours after feeding.  Breasts that are softer immediately after breastfeeding.  Increased milk volume, as well as a change in milk consistency and color by the fifth day of breastfeeding.  Nipples that are not sore, cracked, or bleeding. Signs that your baby is getting enough milk  Wetting at least 1-2 diapers during the first 24 hours after birth.  Wetting at least 5-6 diapers every 24 hours for the first week after birth. The urine should be clear or pale yellow by the age of 5 days.  Wetting 6-8 diapers every 24 hours as your baby continues to grow and develop.  At least 3 stools in a 24-hour period by the age of 5 days. The stool should be soft and yellow.  At least 3 stools in a 24-hour period by the age of 7 days. The stool should be seedy and yellow.  No loss of weight greater than 10% of birth weight during the first 3 days of life.  Average weight gain of 4-7 oz (113-198 g) per week after the age of 4 days.  Consistent daily weight gain by the age of 5 days, without weight loss after the age of 2 weeks. After a feeding, your baby may spit up a small amount of milk. This is normal. Breastfeeding frequency and duration Frequent  feeding will help you make more milk and can prevent sore nipples and extremely full breasts (breast engorgement). Breastfeed when you feel the need to reduce the fullness of your breasts or when your baby  shows signs of hunger. This is called "breastfeeding on demand." Signs that your baby is hungry include:  Increased alertness, activity, or restlessness.  Movement of the head from side to side.  Opening of the mouth when the corner of the mouth or cheek is stroked (rooting).  Increased sucking sounds, smacking lips, cooing, sighing, or squeaking.  Hand-to-mouth movements and sucking on fingers or hands.  Fussing or crying. Avoid introducing a pacifier to your baby in the first 4-6 weeks after your baby is born. After this time, you may choose to use a pacifier. Research has shown that pacifier use during the first year of a baby's life decreases the risk of sudden infant death syndrome (SIDS). Allow your baby to feed on each breast as long as he or she wants. When your baby unlatches or falls asleep while feeding from the first breast, offer the second breast. Because newborns are often sleepy in the first few weeks of life, you may need to awaken your baby to get him or her to feed. Breastfeeding times will vary from baby to baby. However, the following rules can serve as a guide to help you make sure that your baby is properly fed:  Newborns (babies 35 weeks of age or younger) may breastfeed every 1-3 hours.  Newborns should not go without breastfeeding for longer than 3 hours during the day or 5 hours during the night.  You should breastfeed your baby a minimum of 8 times in a 24-hour period. Breast milk pumping Pumping and storing breast milk allows you to make sure that your baby is exclusively fed your breast milk, even at times when you are unable to breastfeed. This is especially important if you go back to work while you are still breastfeeding, or if you are not able to be present  during feedings. Your lactation consultant can help you find a method of pumping that works best for you and give you guidelines about how long it is safe to store breast milk.      Caring for your breasts while you breastfeed Nipples can become dry, cracked, and sore while breastfeeding. The following recommendations can help keep your breasts moisturized and healthy:  Avoid using soap on your nipples.  Wear a supportive bra designed especially for nursing. Avoid wearing underwire-style bras or extremely tight bras (sports bras).  Air-dry your nipples for 3-4 minutes after each feeding.  Use only cotton bra pads to absorb leaked breast milk. Leaking of breast milk between feedings is normal.  Use lanolin on your nipples after breastfeeding. Lanolin helps to maintain your skin's normal moisture barrier. Pure lanolin is not harmful (not toxic) to your baby. You may also hand express a few drops of breast milk and gently massage that milk into your nipples and allow the milk to air-dry. In the first few weeks after giving birth, some women experience breast engorgement. Engorgement can make your breasts feel heavy, warm, and tender to the touch. Engorgement peaks within 3-5 days after you give birth. The following recommendations can help to ease engorgement:  Completely empty your breasts while breastfeeding or pumping. You may want to start by applying warm, moist heat (in the shower or with warm, water-soaked hand towels) just before feeding or pumping. This increases circulation and helps the milk flow. If your baby does not completely empty your breasts while breastfeeding, pump any extra milk after he or she is finished.  Apply ice packs to your breasts immediately after breastfeeding or  pumping, unless this is too uncomfortable for you. To do this: ? Put ice in a plastic bag. ? Place a towel between your skin and the bag. ? Leave the ice on for 20 minutes, 2-3 times a day.  Make sure  that your baby is latched on and positioned properly while breastfeeding. If engorgement persists after 48 hours of following these recommendations, contact your health care provider or a Advertising copywriter. Overall health care recommendations while breastfeeding  Eat 3 healthy meals and 3 snacks every day. Well-nourished mothers who are breastfeeding need an additional 450-500 calories a day. You can meet this requirement by increasing the amount of a balanced diet that you eat.  Drink enough water to keep your urine pale yellow or clear.  Rest often, relax, and continue to take your prenatal vitamins to prevent fatigue, stress, and low vitamin and mineral levels in your body (nutrient deficiencies).  Do not use any products that contain nicotine or tobacco, such as cigarettes and e-cigarettes. Your baby may be harmed by chemicals from cigarettes that pass into breast milk and exposure to secondhand smoke. If you need help quitting, ask your health care provider.  Avoid alcohol.  Do not use illegal drugs or marijuana.  Talk with your health care provider before taking any medicines. These include over-the-counter and prescription medicines as well as vitamins and herbal supplements. Some medicines that may be harmful to your baby can pass through breast milk.  It is possible to become pregnant while breastfeeding. If birth control is desired, ask your health care provider about options that will be safe while breastfeeding your baby. Where to find more information: [employer]: www.llli.org Contact a health care provider if:  You feel like you want to stop breastfeeding or have become frustrated with breastfeeding.  Your nipples are cracked or bleeding.  Your breasts are red, tender, or warm.  You have: ? Painful breasts or nipples. ? A swollen area on either breast. ? A fever or chills. ? Nausea or vomiting. ? Drainage other than breast milk from your  nipples.  Your breasts do not become full before feedings by the fifth day after you give birth.  You feel sad and depressed.  Your baby is: ? Too sleepy to eat well. ? Having trouble sleeping. ? More than 61 week old and wetting fewer than 6 diapers in a 24-hour period. ? Not gaining weight by 30 days of age.  Your baby has fewer than 3 stools in a 24-hour period.  Your baby's skin or the white parts of his or her eyes become yellow. Get help right away if:  Your baby is overly tired (lethargic) and does not want to wake up and feed.  Your baby develops an unexplained fever. Summary  Breastfeeding offers many health benefits for infant and mothers.  Try to breastfeed your infant when he or she shows early signs of hunger.  Gently tickle or stroke your baby's lips with your finger or nipple to allow the baby to open his or her mouth. Bring the baby to your breast. Make sure that much of the areola is in your baby's mouth. Offer one side and burp the baby before you offer the other side.  Talk with your health care provider or lactation consultant if you have questions or you face problems as you breastfeed. This information is not intended to replace advice given to you by your health care provider. Make sure you discuss any questions you  have with your health care provider. Document Revised: 10/12/2017 Document Reviewed: 08/19/2016 Elsevier Patient Education  2021 [employer]

## 2020-08-31 ENCOUNTER — Encounter: Payer: BC Managed Care – PPO | Admitting: Obstetrics & Gynecology

## 2020-09-01 ENCOUNTER — Ambulatory Visit: Payer: BC Managed Care – PPO

## 2020-09-02 ENCOUNTER — Encounter: Payer: Self-pay | Admitting: *Deleted

## 2020-09-02 ENCOUNTER — Ambulatory Visit: Payer: BC Managed Care – PPO

## 2020-09-02 ENCOUNTER — Ambulatory Visit (INDEPENDENT_AMBULATORY_CARE_PROVIDER_SITE_OTHER): Payer: BC Managed Care – PPO | Admitting: *Deleted

## 2020-09-02 ENCOUNTER — Other Ambulatory Visit: Payer: Self-pay

## 2020-09-02 DIAGNOSIS — O133 Gestational [pregnancy-induced] hypertension without significant proteinuria, third trimester: Secondary | ICD-10-CM

## 2020-09-02 NOTE — Progress Notes (Signed)
ATTESTATION OF SUPERVISION OF RN: Evaluation and management procedures were performed by the RN under my supervision and collaboration. I have reviewed the nursing note and chart and agree with the management and plan for this patient.  Makesha Belitz, CNM  

## 2020-09-02 NOTE — Progress Notes (Signed)
Subjective:  Lacey Guerrero is a 30 y.o. female here for BP check.   Hypertension ROS: taking medications as instructed, no medication side effects noted, no TIA's, no chest pain on exertion, no dyspnea on exertion and no swelling of ankles.    Objective:  BP 124/84   Pulse 66   Appearance alert, well appearing, and in no distress. General exam BP noted to be well controlled today in office.    Assessment:   Blood Pressure well controlled.   Plan:  Follow up at postpartum visit or as needed. Marland Kitchen

## 2020-09-11 DIAGNOSIS — M5489 Other dorsalgia: Secondary | ICD-10-CM | POA: Diagnosis not present

## 2020-10-07 ENCOUNTER — Other Ambulatory Visit: Payer: Self-pay

## 2020-10-07 ENCOUNTER — Other Ambulatory Visit (HOSPITAL_COMMUNITY)
Admission: RE | Admit: 2020-10-07 | Discharge: 2020-10-07 | Disposition: A | Discharge status: Home / Self Care | Payer: BC Managed Care – PPO | Source: Ambulatory Visit | Attending: Family Medicine | Admitting: Family Medicine

## 2020-10-07 ENCOUNTER — Ambulatory Visit (INDEPENDENT_AMBULATORY_CARE_PROVIDER_SITE_OTHER): Payer: BC Managed Care – PPO | Admitting: Family Medicine

## 2020-10-07 DIAGNOSIS — K429 Umbilical hernia without obstruction or gangrene: Secondary | ICD-10-CM

## 2020-10-07 DIAGNOSIS — Z30014 Encounter for initial prescription of intrauterine contraceptive device: Secondary | ICD-10-CM | POA: Diagnosis not present

## 2020-10-07 MED ORDER — PARAGARD INTRAUTERINE COPPER IU IUD: INTRAUTERINE_SYSTEM | Freq: Once | INTRAUTERINE | Status: AC

## 2020-10-07 NOTE — Progress Notes (Signed)
Post Partum Visit Note  Lacey Guerrero is a 30 y.o. [id number] female who presents for a postpartum visit. She is 6 weeks postpartum following a normal spontaneous vaginal delivery.  I have fully reviewed the prenatal and intrapartum course. The delivery was at 36.6 gestational weeks.  Anesthesia: epidural. Postpartum course has been uncomplicated Baby is doing well. Baby is feeding by breast. Bleeding has not stoppped, still spotting with bright red blood.. Bowel function is normal. Bladder function is normal. Patient is not sexually active. Contraception method is IUD. Postpartum depression screening: negative.   The pregnancy intention screening data noted above was reviewed. Potential methods of contraception were discussed. The patient elected to proceed with IUD or IUS.    Edinburgh Postnatal Depression Scale - 10/07/20 1120      Edinburgh Postnatal Depression Scale:  In the Past 7 Days   I have been able to laugh and see the funny side of things. 0    I have looked forward with enjoyment to things. 0    I have blamed myself unnecessarily when things went wrong. 1    I have been anxious or worried for no good reason. 2    I have felt scared or panicky for no good reason. 1    Things have been getting on top of me. 1    I have been so unhappy that I have had difficulty sleeping. 0    I have felt sad or miserable. 1    I have been so unhappy that I have been crying. 1    The thought of harming myself has occurred to me. 0    Edinburgh Postnatal Depression Scale Total 7            The following portions of the patient's history were reviewed and updated as appropriate: allergies, current medications, past family history, past medical history, past social history, past surgical history and problem list.  Review of Systems Pertinent items are noted in HPI.    Objective:  BP 112/71   Pulse 61   Wt 152 lb (68.9 kg)   BMI 24.53 kg/m    General:  alert, cooperative and appears  stated age   Breasts:  inspection negative, no nipple discharge or bleeding, no masses or nodularity palpable  Lungs: clear to auscultation bilaterally  Heart:  regular rate and rhythm, S1, S2 normal, no murmur, click, rub or gallop  Abdomen: soft, non-tender; bowel sounds normal; no masses,  no organomegaly   Vulva:  normal  Vagina: normal vagina, no discharge, exudate, lesion, or erythema  Cervix:  multiparous appearance  Corpus: normal  Adnexa:  normal adnexa  Rectal Exam: Not performed.            Patient presented to [facility] for IUD insertion. Her GC/CT screening was found to be up to date and using WHO criteria we can be reasonably certain she is not pregnant or a pregnancy test was obtained which was Urine pregnancy test  today was N/A.  See Flowsheet for IUD check list  IUD Insertion Procedure Note- Copper IUD Patient identified, informed consent performed, consent signed.   Discussed risks of irregular bleeding, cramping, infection, malpositioning or misplacement of the IUD outside the uterus which may require further procedure such as laparoscopy. Time out was performed.    Speculum placed in the vagina.  Cervix visualized.  Cleaned with Betadine x 2.  Grasped anteriorly with a single tooth tenaculum.  Uterus sounded to 9 cm.  IUD  placed per manufacturer's recommendations.  Strings trimmed to 3 cm. Tenaculum was removed, good hemostasis noted.  Patient tolerated procedure well.   Patient was given post-procedure instructions- both agency handout and verbally by provider.  She was advised to have backup contraception for one week.  Patient was also asked to check IUD strings periodically or follow up in 4 weeks for IUD check.  Assessment:    normal postpartum exam. Pap smear done at today's visit.   Plan:   Essential components of care per ACOG recommendations:  1.  Mood and well being: Patient with negative depression screening today. Reviewed local resources for support.  -  Patient does not use tobacco.  - hx of drug use? No  *  2. Infant care and feeding:  -Patient currently breastmilk feeding? Yes -- nursing at the breast now and discussed return to work and pumping.Reviewed importance of draining breast regularly to support lactation. - Patient has implants and reports some odd sensations-- likely related to breast fullness.engorgment. Recommended waiting until 12 weeks to assess implants as mild production shifts to supply/demand -Social determinants of health (SDOH) reviewed in EPIC. No concerns  3. Sexuality, contraception and birth spacing - Patient does not want a pregnancy in the next year.  Desired family size is 2 children.  - Reviewed forms of contraception in tiered fashion. Patient desired IUD today- placed cu-IUD. Also partner plans on vasectomy and this was discussed today. After they have procedure and testing to assure no sperm present in ejaculate, patient can remove IUD if desired.   - Discussed birth spacing of 18 months  4. Sleep and fatigue -Encouraged family/partner/community support of 4 hrs of uninterrupted sleep to help with mood and fatigue  5. Physical Recovery  - Discussed patients delivery and complications - Patient had a 1st degree laceration, perineal healing reviewed. Patient expressed understanding - Patient has urinary incontinence? No Patient was referred to pelvic floor PT  - Patient is safe to resume physical and sexual activity  6.  Health Maintenance - Pap today  7. Chronic Disease - Umbilical hernia- referral to gen surg made   Federico Flake, MD Center for [facility], [facility]

## 2020-10-08 ENCOUNTER — Encounter: Payer: Self-pay | Admitting: Family Medicine

## 2020-10-09 LAB — CYTOLOGY - PAP: Diagnosis: NEGATIVE

## 2020-11-04 ENCOUNTER — Other Ambulatory Visit: Payer: Self-pay

## 2020-11-04 ENCOUNTER — Encounter: Payer: Self-pay | Admitting: Family Medicine

## 2020-11-04 ENCOUNTER — Ambulatory Visit (INDEPENDENT_AMBULATORY_CARE_PROVIDER_SITE_OTHER): Payer: BC Managed Care – PPO | Admitting: Family Medicine

## 2020-11-04 VITALS — BP 129/84 | HR 70

## 2020-11-04 DIAGNOSIS — Z30431 Encounter for routine checking of intrauterine contraceptive device: Secondary | ICD-10-CM | POA: Diagnosis not present

## 2020-11-04 NOTE — Progress Notes (Signed)
    GYNECOLOGY CLINIC- IUD STRING CHECK PROGRESS NOTE  History:  30 y.o. [id number] here today for today for IUD string check; Paragard was placed  10/07/20. Having some spotting. no concerning side effects.  The following portions of the patient's history were reviewed and updated as appropriate: allergies, current medications, past family history, past medical history, past social history, past surgical history and problem list. Last pap smear was normal  Review of Systems:  Pertinent items are noted in HPI.   Objective:  Physical Exam Blood pressure 129/84, pulse 70, unknown if currently breastfeeding. Gen: NAD Abd: Soft, nontender and nondistended Pelvic: Normal appearing external genitalia; normal appearing vaginal mucosa and cervix.  IUD strings visualized, about 3 cm in length outside cervix.   Assessment & Plan:  Normal IUD check. Patient to keep IUD in place for five years; can come in for removal if she desires pregnancy within the next five years. Routine preventative health maintenance measures emphasized  #Breast implants - reports feeling the right implant is "weird"  - Will order imaging to reassure in 3-4 weeks if still present as milk will regulate by that time.   Federico Flake, MD  Faculty Practice  Center for [employer], [facility]

## 2020-11-05 DIAGNOSIS — K429 Umbilical hernia without obstruction or gangrene: Secondary | ICD-10-CM | POA: Diagnosis not present

## 2021-05-19 ENCOUNTER — Ambulatory Visit (INDEPENDENT_AMBULATORY_CARE_PROVIDER_SITE_OTHER): Payer: BC Managed Care – PPO | Admitting: Family Medicine

## 2021-05-19 ENCOUNTER — Other Ambulatory Visit: Payer: Self-pay

## 2021-05-19 ENCOUNTER — Encounter: Payer: Self-pay | Admitting: Family Medicine

## 2021-05-19 VITALS — BP 112/69 | HR 87 | Wt 137.0 lb

## 2021-05-19 DIAGNOSIS — R5383 Other fatigue: Secondary | ICD-10-CM

## 2021-05-19 DIAGNOSIS — Z9882 Breast implant status: Secondary | ICD-10-CM

## 2021-05-19 LAB — CBC
Hematocrit: 41.6 % (ref 34.0–46.6)
Hemoglobin: 13.8 g/dL (ref 11.1–15.9)
MCH: 30.5 pg (ref 26.6–33.0)
MCHC: 33.2 g/dL (ref 31.5–35.7)
MCV: 92 fL (ref 79–97)
Platelets: 284 10*3/uL (ref 150–450)
RBC: 4.52 x10E6/uL (ref 3.77–5.28)
RDW: 11.9 % (ref 11.7–15.4)
WBC: 7.2 10*3/uL (ref 3.4–10.8)

## 2021-05-19 NOTE — Progress Notes (Signed)
   GYNECOLOGY PROBLEM  VISIT ENCOUNTER NOTE  Subjective:   Lacey Guerrero is a 30 y.o. [phone] female here for a a problem GYN visit.  Current complaints: see below.     Last week HA everyday- take NSAID, and it goes away. Increased water  IUD- had first period 2 wk ago and had spotting with sex. Would like string check  Breast implant; left still feels odd in her chest. She continues to pump. Pumps 3 times per day and yield is 24-30 oz. Infant does not latch any longer.   Denies abnormal vaginal bleeding, discharge, pelvic pain, problems with intercourse or other gynecologic concerns.    Gynecologic History Patient's last menstrual period was 05/01/2021 (exact date). Contraception: IUD  Health Maintenance Due  Topic Date Due   INFLUENZA VACCINE  03/01/2021    The following portions of the patient's history were reviewed and updated as appropriate: allergies, current medications, past family history, past medical history, past social history, past surgical history and problem list.  Review of Systems Pertinent items are noted in HPI.   Objective:  BP 112/69   Pulse 87   Wt 137 lb (62.1 kg)   LMP 05/01/2021 (Exact Date)   Breastfeeding Yes Comment: Pumping  BMI 22.11 kg/m  Gen: well appearing, NAD HEENT: no scleral icterus CV: RR Lung: Normal WOB Ext: warm well perfused  PELVIC: Normal appearing external genitalia; normal appearing vaginal mucosa and cervix.  No abnormal discharge noted.  Normal uterine size, no other palpable masses, no uterine or adnexal tenderness. IUD strings seen    Assessment and Plan:   1. Fatigue, unspecified type - TSH - CBC  2. Breast implant in situ Desires work up with breast surgery Implant doesn't "feel right" Surgery was > 1 year prior Heather contact [facility] who will see the patient since she is > 1 year post op for evaluation of the implant.   3. HA- continue to manage with OTC NSAID  Please refer to  After Visit Summary for other counseling recommendations.   Return if symptoms worsen or fail to improve.  Federico Flake, MD, MPH, ABFM Attending Physician Faculty Practice- Center for [facility] - [facility]

## 2021-05-19 NOTE — Progress Notes (Signed)
GYN presents for IUD check and Breast exam and discuss issues  Pt has complaints fatigue, nausea, HA's pt states she feels off. Having spotting and cramping w/copper IUD. Pt states implant in left breast feels as if it may be out place since breast feeding.

## 2021-05-20 LAB — TSH: TSH: 1.92 u[IU]/mL (ref 0.450–4.500)

## 2021-09-25 ENCOUNTER — Emergency Department (HOSPITAL_COMMUNITY)
Admission: EM | Admit: 2021-09-25 | Discharge: 2021-09-25 | Discharge status: Against Medical Advice | Payer: BC Managed Care – PPO | Source: Home / Self Care

## 2022-03-07 ENCOUNTER — Telehealth: Payer: Self-pay

## 2022-03-07 ENCOUNTER — Encounter: Payer: Self-pay | Admitting: Family Medicine

## 2022-03-07 NOTE — Telephone Encounter (Signed)
Pt called in needing an appt due to her IUD discomfort. Notified pt of first available appt and offered cancellation list. Pt refused. Advised pt if symptoms are unbearable to seek and urgent care. Nurse line unavailable. Notified pt she may leave a message with the nurse.

## 2022-03-09 ENCOUNTER — Encounter: Payer: Self-pay | Admitting: Family Medicine

## 2022-03-09 ENCOUNTER — Ambulatory Visit (INDEPENDENT_AMBULATORY_CARE_PROVIDER_SITE_OTHER): Payer: BC Managed Care – PPO | Admitting: Family Medicine

## 2022-03-09 VITALS — BP 110/73 | HR 67 | Wt 135.0 lb

## 2022-03-09 DIAGNOSIS — N941 Unspecified dyspareunia: Secondary | ICD-10-CM

## 2022-03-09 DIAGNOSIS — T8332XA Displacement of intrauterine contraceptive device, initial encounter: Secondary | ICD-10-CM

## 2022-03-09 DIAGNOSIS — Z30011 Encounter for initial prescription of contraceptive pills: Secondary | ICD-10-CM | POA: Diagnosis not present

## 2022-03-09 DIAGNOSIS — R102 Pelvic and perineal pain: Secondary | ICD-10-CM | POA: Diagnosis not present

## 2022-03-09 DIAGNOSIS — Z30432 Encounter for removal of intrauterine contraceptive device: Secondary | ICD-10-CM | POA: Diagnosis not present

## 2022-03-09 MED ORDER — NORETHIN-ETH ESTRAD-FE BIPHAS 1 MG-10 MCG / 10 MCG PO TABS
1.0000 | ORAL_TABLET | Freq: Every day | ORAL | 3 refills | Status: DC
Start: 2022-03-09 — End: 2022-08-12

## 2022-03-09 NOTE — Progress Notes (Signed)
   Subjective:    Patient ID: Lacey Guerrero is a 31 y.o. female presenting with Contraception (Request removal )  on 03/09/2022  HPI: Has CuIUD in place. Reports 2 episodes of severe pain with intercourse which she thinks is related to her IUD, possibly moving. She would love to go back on her LoLoestrin, which worked well for her in thepast and have her IUD removed. Placed pp and in x 2 years.  Review of Systems  Constitutional:  Negative for chills and fever.  Respiratory:  Negative for shortness of breath.   Cardiovascular:  Negative for chest pain.  Gastrointestinal:  Negative for abdominal pain, nausea and vomiting.  Genitourinary:  Positive for pelvic pain (cramping). Negative for dysuria.  Skin:  Negative for rash.      Objective:    BP 110/73   Pulse 67   Wt 135 lb (61.2 kg)   BMI 21.79 kg/m  Physical Exam Exam conducted with a chaperone present.  Constitutional:      General: She is not in acute distress.    Appearance: She is well-developed.  HENT:     Head: Normocephalic and atraumatic.  Eyes:     General: No scleral icterus. Cardiovascular:     Rate and Rhythm: Normal rate.  Pulmonary:     Effort: Pulmonary effort is normal.  Abdominal:     Palpations: Abdomen is soft.  Genitourinary:    Comments: BUS normal, vagina is pink and rugated, cervix is parous without lesion, uterus is small and anteverted, no adnexal mass or tenderness. IUD strings are not noted. Musculoskeletal:     Cervical back: Neck supple.  Skin:    General: Skin is warm and dry.  Neurological:     Mental Status: She is alert and oriented to person, place, and time.    Procedure: Speculum placed inside vagina.  Cervix visualized. Strings not seen. Attempted to tease out with cytobrush and could not see. Attempted removal x 3 and could not remove. Patient moved to u/s room for u/s guidance. IUD noted in uterus. Uterine Dressing forcep used to grasp IUD with u/s guidance.  IUD removed  intact.      Assessment & Plan:  Initiation of oral contraception - begin Lo Loestrin - Plan: Norethindrone-Ethinyl Estradiol-Fe Biphas (LO LOESTRIN FE) 1 MG-10 MCG / 10 MCG tablet  Encounter for IUD removal - s/p removal   Return if symptoms worsen or fail to improve.  Reva Bores, MD 03/09/2022 12:55 PM

## 2022-03-09 NOTE — Progress Notes (Signed)
Would like to get IUD removed and go back on Lolo estrin

## 2022-04-25 IMAGING — US US MFM OB COMPLETE +14 WKS
1 series · 14 of 28 positions shown · non-contrast
Comparison: none

[Series 1: us mfm ob complete +14 wks · 14 of 53 slices shown]
[im 2/53]
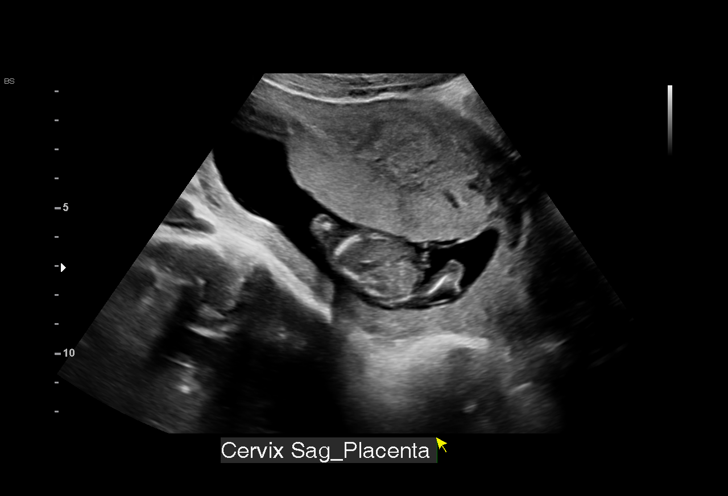
[im 6/53]
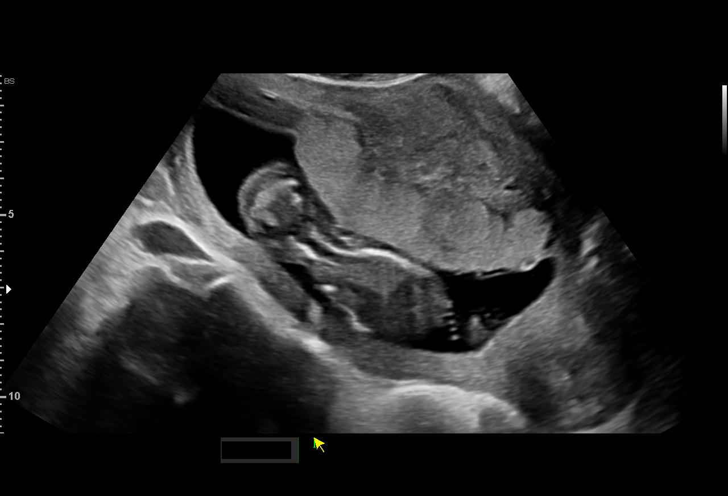
[im 10/53]
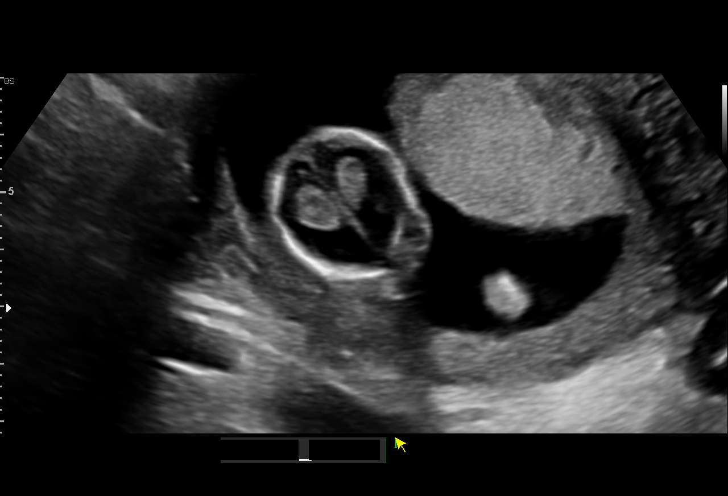
[im 14/53]
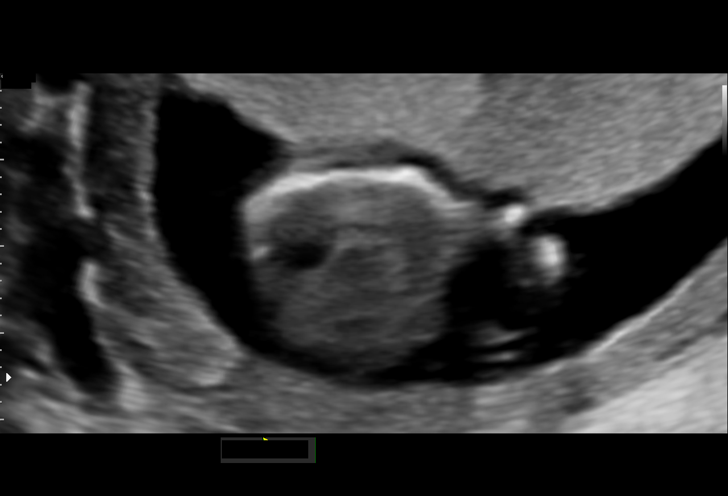
[im 18/53]
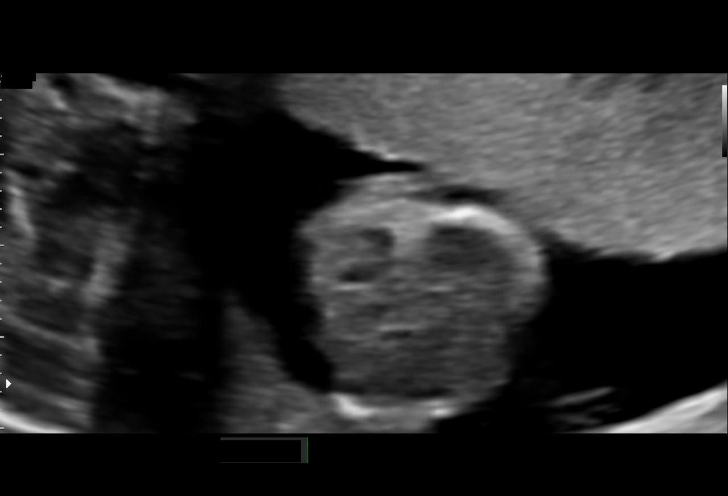
[im 22/53]
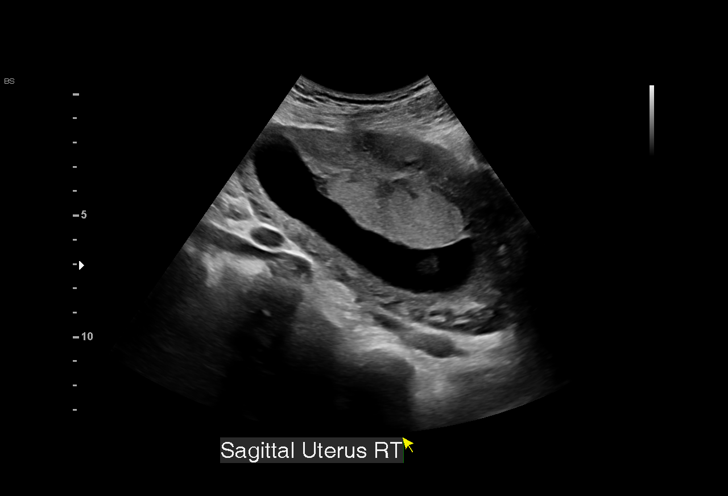
[im 26/53]
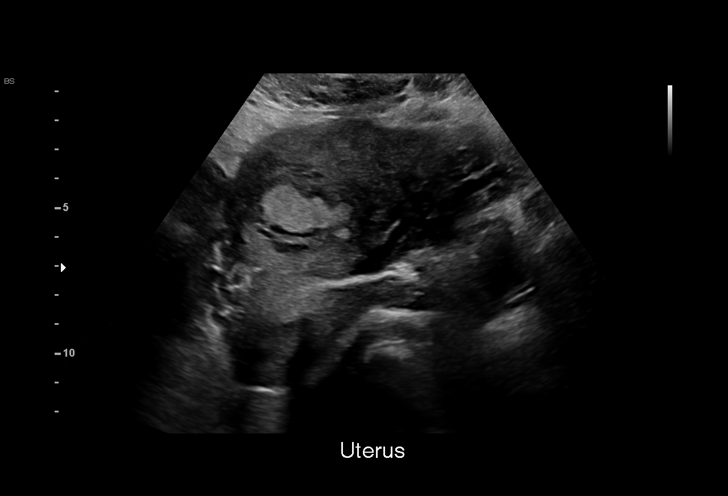
[im 29/53]
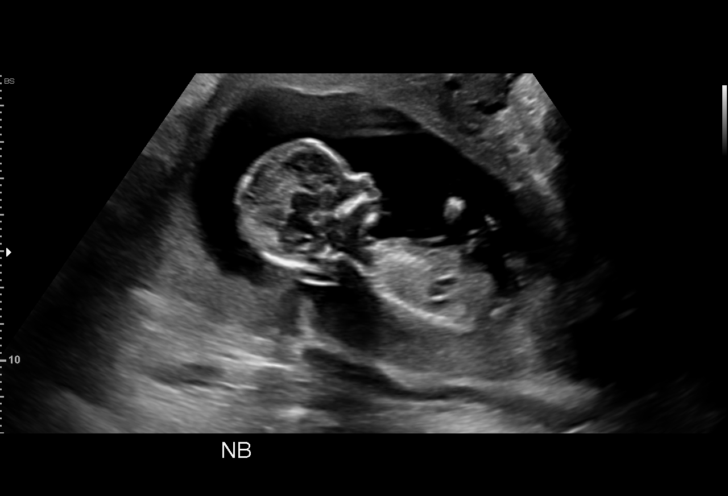
[im 33/53]
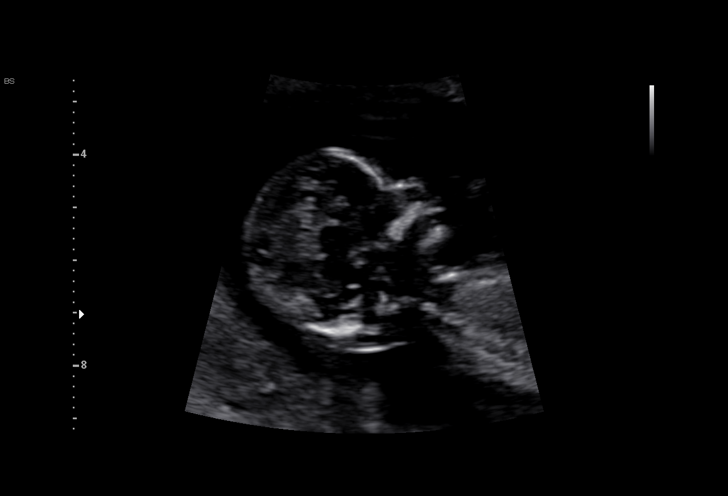
[im 37/53]
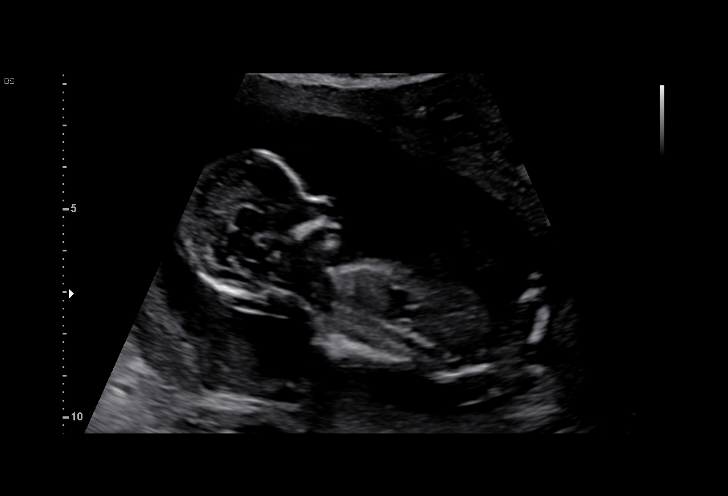
[im 41/53]
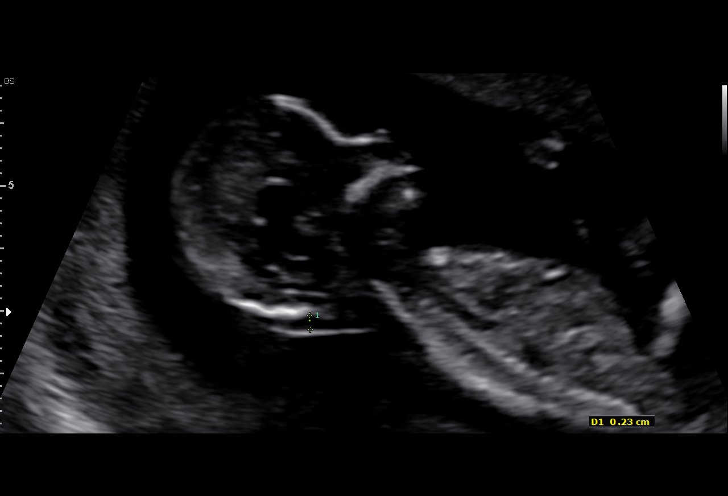
[im 45/53]
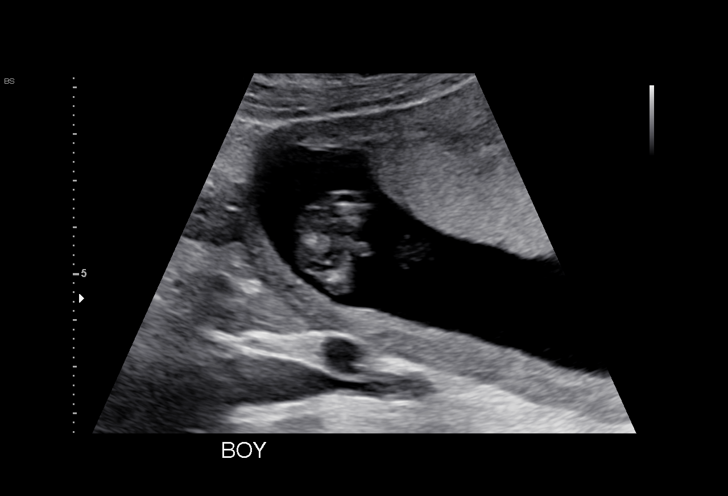
[im 49/53]
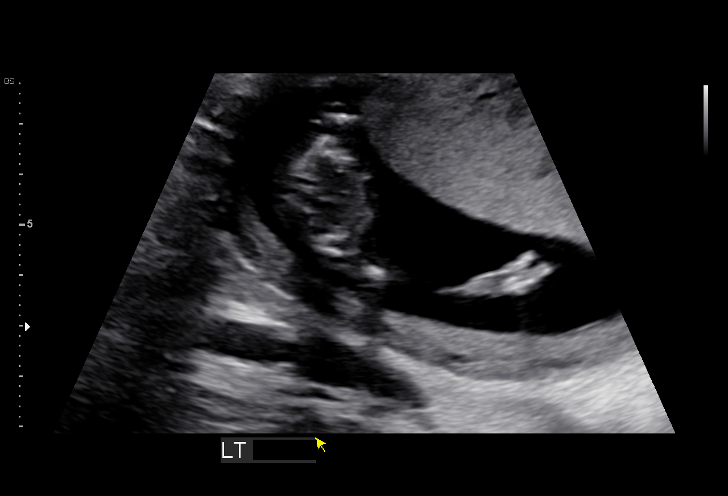
[im 53/53]
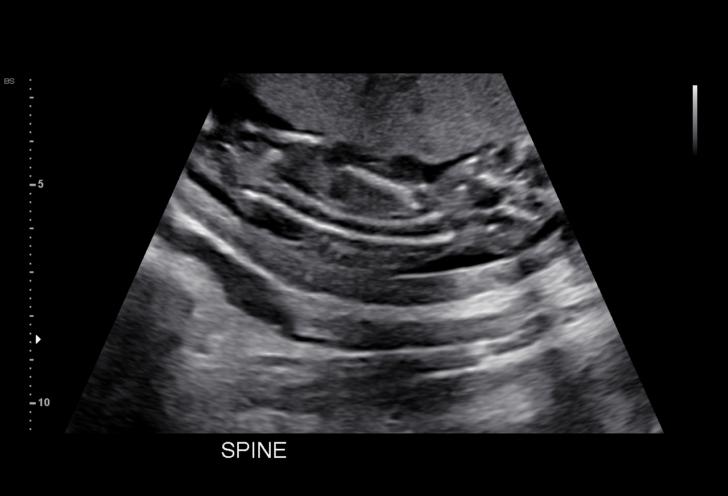

[14 of 28 positions shown; findings below may reference images not displayed]

1  US MFM OB COMP LESS THAN              76801.4     ADONIAS FACEY
    14 WEEKS

Indications

 13 weeks gestation of pregnancy
 Encounter for antenatal screening for
 malformations
 Drug use complicating pregnancy, first
 trimester (topical retinoid)
Fetal Evaluation

 Num Of Fetuses:         1
 Fetal Heart Rate(bpm):  150
 Cardiac Activity:       Observed
 Presentation:           Breech
 Placenta:               Anterior

 Amniotic Fluid
 AFI FV:      Within normal limits
OB History

 Gravidity:    3
 Living:       1
Gestational Age

 Best:          13w 6d     Det. By:  Previous Ultrasound      EDD:   09/15/20
1st Trimester Genetic Sonogram Screening

 CRL:            83.7  mm    G. Age:   13w 6d                 EDD:   09/15/20
 Nuc Trans:       2.3  mm
 Nasal Bone:                 Present
Anatomy

 Cranium:               Appears normal         Kidneys:                Appear normal
 Choroid Plexus:        Appears normal         Bladder:                Appears normal
 Face:                  Appears normal         Spine:                  Appears normal
 Stomach:               Appears normal, left   Upper Extremities:      Visualized
                        sided
 Abdominal Wall:        Appears nml (cord      Lower Extremities:      Visualized
                        insert, abd wall)
Cervix Uterus Adnexa

 Cervix
 Closed

 Uterus
 No abnormality visualized.

 Right Ovary
 Not visualized.

 Left Ovary
 Within normal limits.

 Cul De Sac
 No free fluid seen.

 Adnexa
 No adnexal mass visualized.
Impression

 G3 P1.  Patient is here for first trimester anatomy scan.
 Obstetric history significant for a term vaginal delivery.
 Patient reports using topical retinoid cream (did not take oral
 Isotretinoin).

 On cell-free fetal DNA screening, the risks of fetal
 aneuploidies are not increased .

 On ultrasound, the CRL measurement is consistent with her
 previously-established dates and good fetal heart activity is
 seen. The nuchal translucency (NT) measures
 millimeters, which is normal (not used for risk calculation).
 Fetal anatomy that could be ascertained at this gestational
 age is normal.

 I have reassured the patient of the findings.  I explained that
 cell free fetal DNA screening is superior to nuchal
 translucency screening for Down syndrome.  I also reassured
 her that topical retinoid cream is unlikely to be associated
 with fetal congenital malformations.  There is a background
 risk of 2 to 3% fetal congenital malformations in pregnant
 women.  Oral isotretinoin is associated with congenital
 malformations.
Recommendations

 -An appointment was made for her to return in 5 weeks for
 fetal anatomy scan.
                 Mundial, [place]

## 2022-05-30 IMAGING — US US MFM OB DETAIL+14 WK
1 series · 13 of 28 positions shown · non-contrast
Comparison: none

[Series 1: us mfm ob detail+14 wk · 13 of 71 slices shown]
[im 3/71]
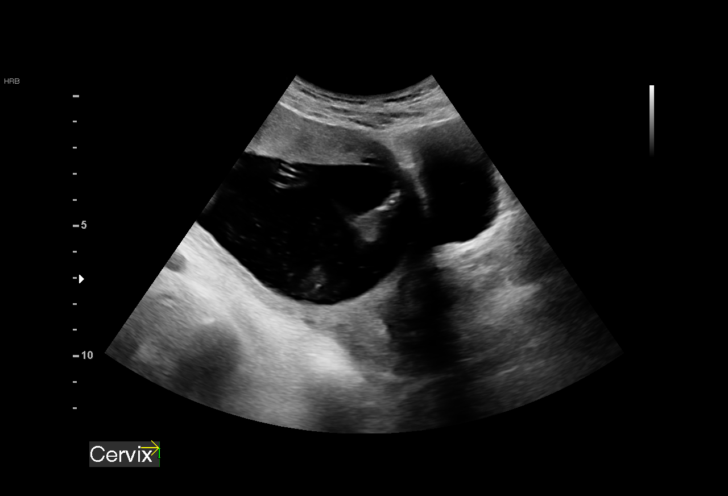
[im 8/71]
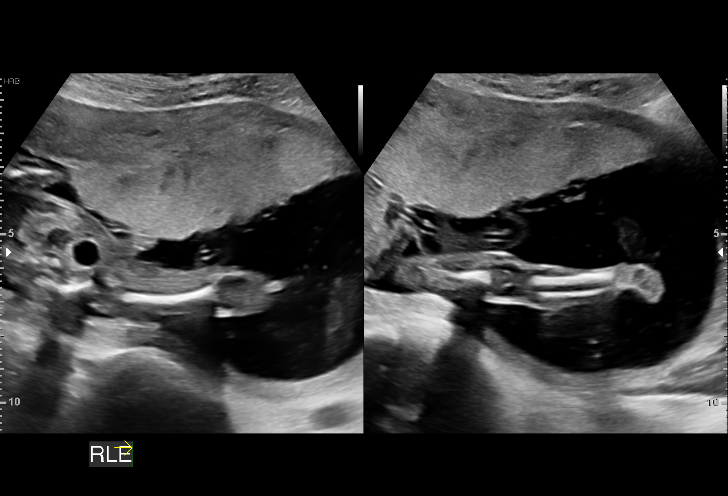
[im 13/71]
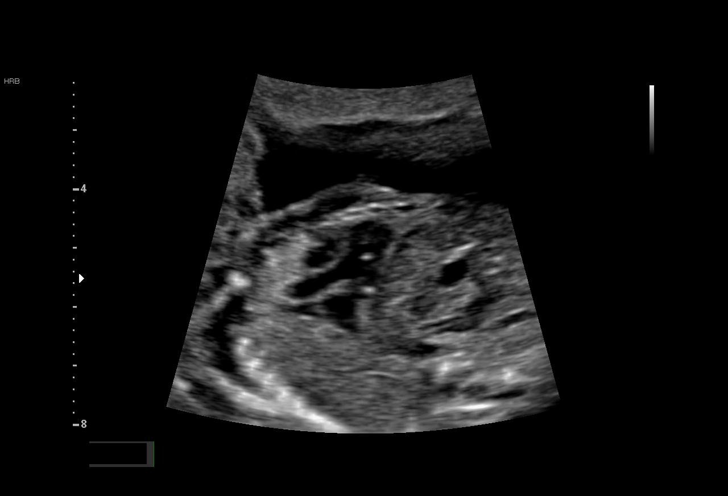
[im 19/71]
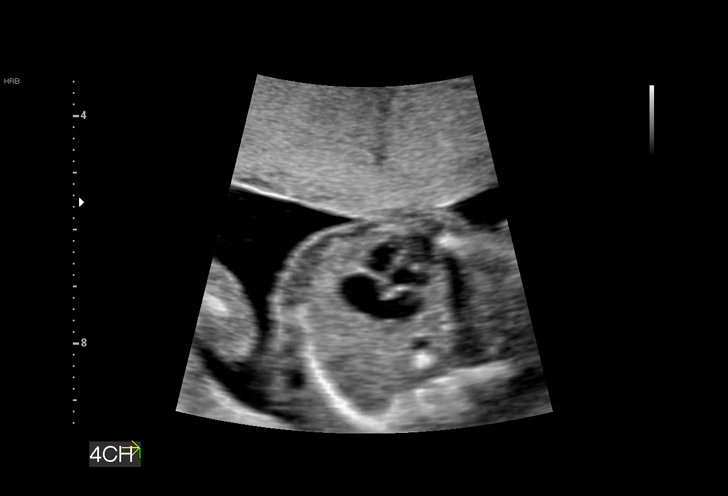
[im 24/71]
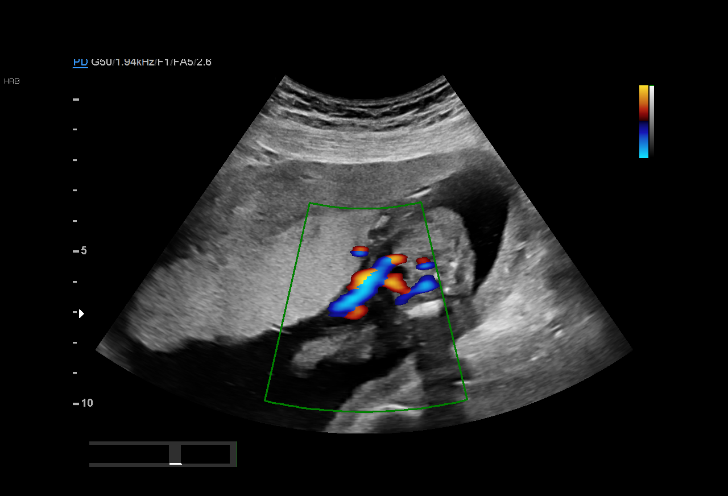
[im 29/71]
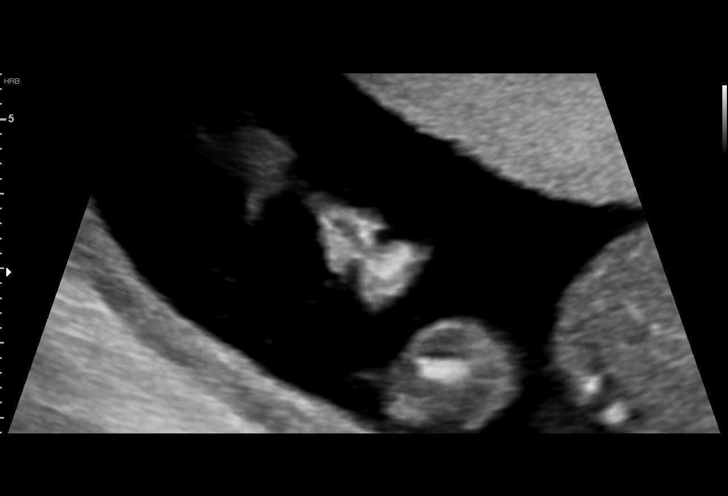
[im 37/71]
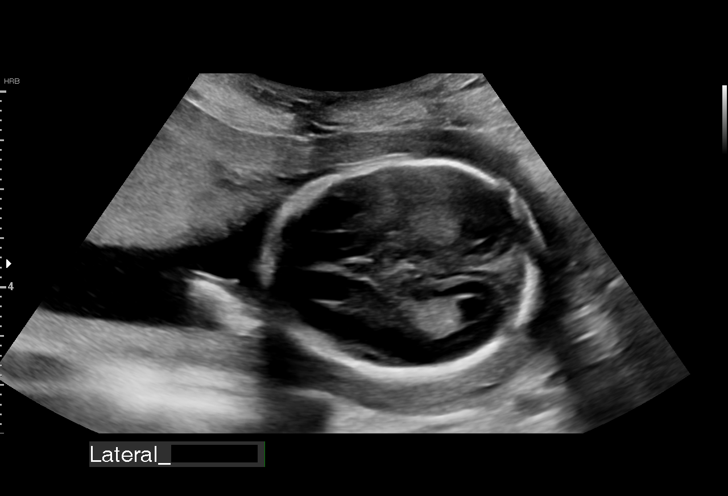
[im 42/71]
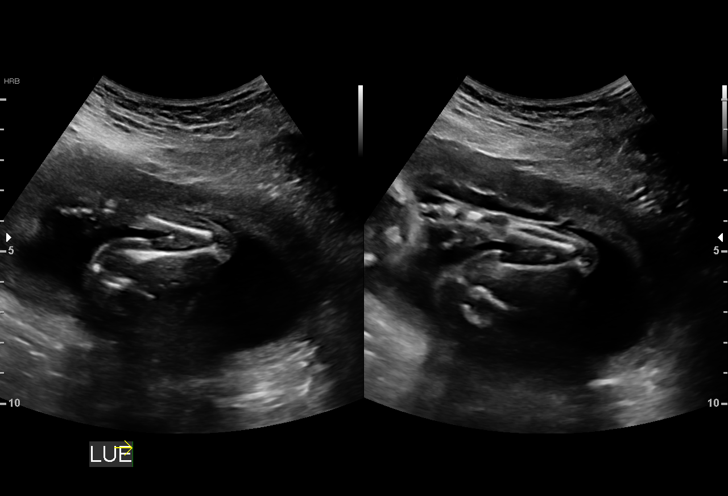
[im 47/71]
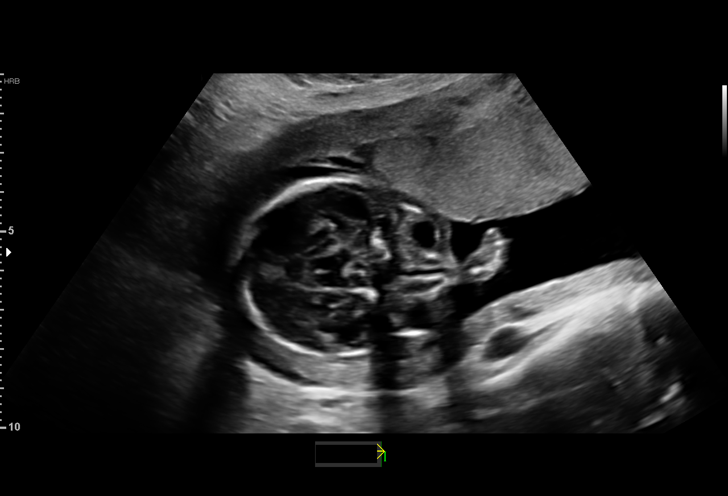
[im 52/71]
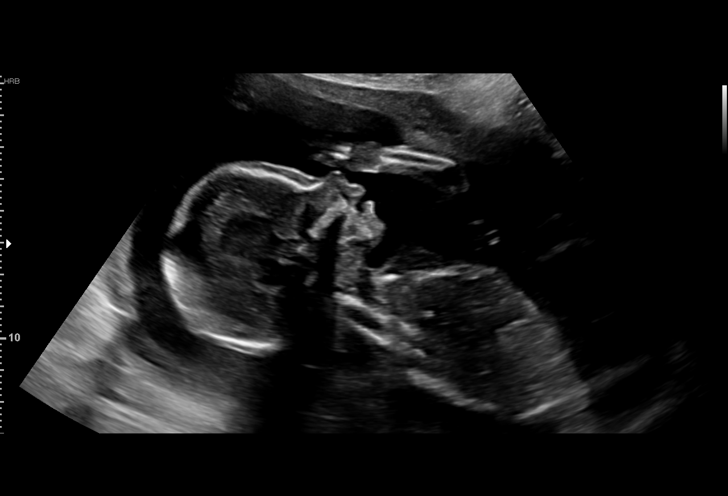
[im 58/71]
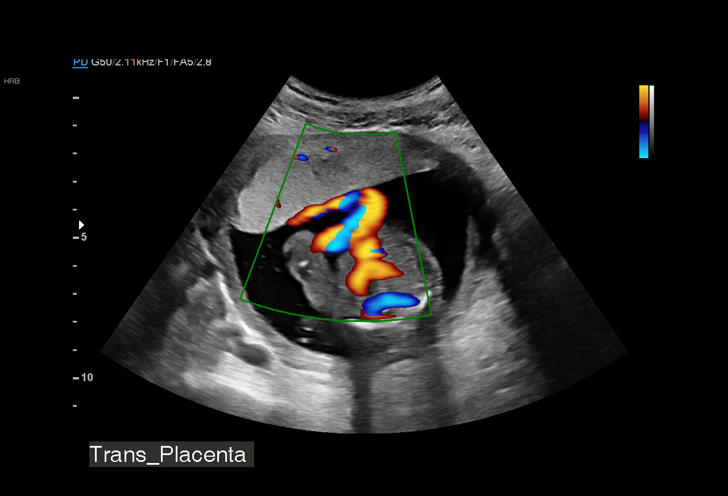
[im 63/71]
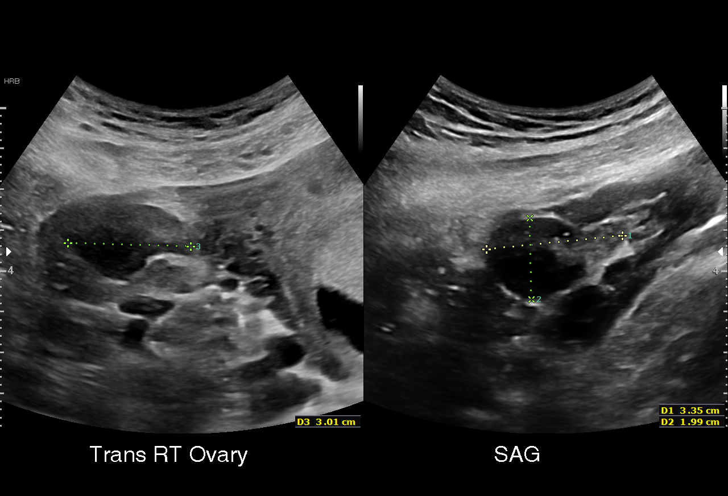
[im 68/71]
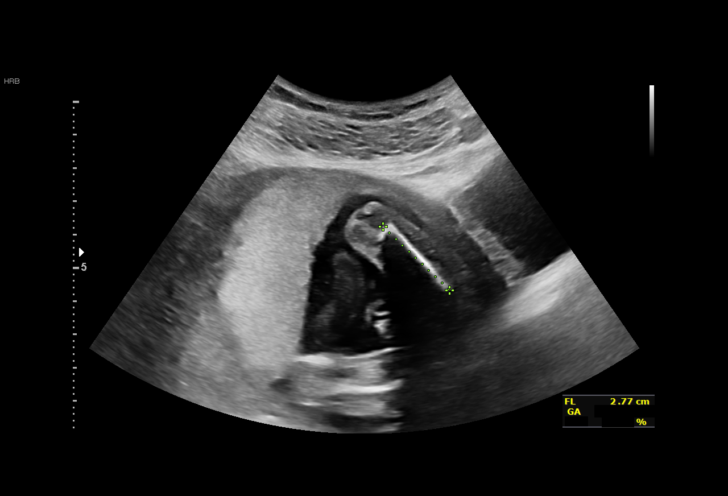

[13 of 28 positions shown; findings below may reference images not displayed]

Indications

 Encounter for antenatal screening for
 malformations (low risk NIPS)
 Teratogen exposure in pregnancy  (topical
 retinoid)
 18 weeks gestation of pregnancy
 Echogenic intracardiac focus of the heart
 (EIF)
Fetal Evaluation

 Num Of Fetuses:         1
 Fetal Heart Rate(bpm):  130
 Cardiac Activity:       Observed
 Presentation:           Breech, footling
 Placenta:               Anterior
 P. Cord Insertion:      Visualized, central

 Amniotic Fluid
 AFI FV:      Within normal limits

                             Largest Pocket(cm)

Biometry

 BPD:      45.1  mm     G. Age:  19w 4d         81  %    CI:        72.18   %    70 - 86
                                                         FL/HC:      16.4   %    16.1 -
 HC:      168.9  mm     G. Age:  19w 4d         75  %    HC/AC:      1.10        1.09 -
 AC:      154.2  mm     G. Age:  20w 4d         92  %    FL/BPD:     61.4   %
 FL:       27.7  mm     G. Age:  18w 3d         29  %    FL/AC:      18.0   %    20 - 24
 CER:      19.5  mm     G. Age:  19w 0d         43  %
 NFT:       5.2  mm
 LV:        5.2  mm
 CM:        3.9  mm

 Est. FW:     303  gm    0 lb 11 oz      87  %
OB History

 Gravidity:    3
 Living:       1
Gestational Age

 U/S Today:     19w 4d                                        EDD:   09/10/20
 Best:          18w 6d     Det. By:  Previous Ultrasound      EDD:   09/15/20
Anatomy

 Cranium:               Appears normal         Aortic Arch:            Appears normal
 Cavum:                 Appears normal         Ductal Arch:            Appears normal
 Ventricles:            Appears normal         Diaphragm:              Appears normal
 Choroid Plexus:        Appears normal         Stomach:                Appears normal, left
                                                                       sided
 Cerebellum:            Appears normal         Abdomen:                Appears normal
 Posterior Fossa:       Appears normal         Abdominal Wall:         Appears nml (cord
                                                                       insert, abd wall)
 Nuchal Fold:           Appears normal         Cord Vessels:           Appears normal (3
                                                                       vessel cord)
 Face:                  Appears normal         Kidneys:                Appear normal
                        (orbits and profile)
 Lips:                  Appears normal         Bladder:                Appears normal
 Thoracic:              Appears normal         Spine:                  Appears normal
 Heart:                 Appears normal; EIF    Upper Extremities:      Appears normal
 RVOT:                  Appears normal         Lower Extremities:      Appears normal
 LVOT:                  Appears normal

 Other:  Fetus appears to be a male. Heels and 5th digit visualized. Nasal
         bone visualized. Open hands visualized.
Cervix Uterus Adnexa

 Cervix
 Length:            3.8  cm.
 Not visualized (advanced GA >97wks)

 Uterus
 No abnormality visualized.

 Right Ovary
 Within normal limits.

 Left Ovary
 Within normal limits.
 Cul De Sac
 No free fluid seen.

 Adnexa
 No abnormality visualized.
Impression

 G3 P1.  Patient is here for fetal anatomy scan.
 We performed fetal anatomy scan. An echogenic intracardiac
 focus is seen. No other makers of aneuploidies or fetal
 structural defects are seen. Fetal biometry is consistent with
 her previously-established dates. Amniotic fluid is normal and
 good fetal activity is seen.
 I informed the patient that given that she had Sobitjon Hoa for fetal
 aneuploidies on cell-free fetal DNA screening, finding of
 echogenic intracardiac focus should be considered a normal
 variant and that the risk of trisomy 21 is not increased. I also
 reassured that echogenic focus does not increase the risk of
 cardiac defects. I also informed her that only amniocentesis
 will give a defintive result on the fetal karyotype.
 Patient opted not to have amniocentesis.
Recommendations

 -An appointment was made for her to return in 6 weeks for
 fetal growth assessment (today's EFW at the 87th percentile).
                 Joshjax, [place]

## 2022-07-11 ENCOUNTER — Other Ambulatory Visit: Payer: Self-pay | Admitting: *Deleted

## 2022-07-11 MED ORDER — VALACYCLOVIR HCL 1 G PO TABS
1000.0000 mg | ORAL_TABLET | Freq: Every day | ORAL | 2 refills | Status: DC
Start: 2022-07-11 — End: 2022-08-12

## 2022-08-12 ENCOUNTER — Ambulatory Visit: Payer: 59 | Admitting: Family Medicine

## 2022-08-12 ENCOUNTER — Encounter: Payer: Self-pay | Admitting: Family Medicine

## 2022-08-12 VITALS — BP 117/79 | HR 71 | Ht 66.0 in | Wt 140.0 lb

## 2022-08-12 DIAGNOSIS — Z01419 Encounter for gynecological examination (general) (routine) without abnormal findings: Secondary | ICD-10-CM

## 2022-08-12 DIAGNOSIS — Z3041 Encounter for surveillance of contraceptive pills: Secondary | ICD-10-CM

## 2022-08-12 DIAGNOSIS — B009 Herpesviral infection, unspecified: Secondary | ICD-10-CM

## 2022-08-12 DIAGNOSIS — Z23 Encounter for immunization: Secondary | ICD-10-CM | POA: Diagnosis not present

## 2022-08-12 MED ORDER — NORETHIN-ETH ESTRAD-FE BIPHAS 1 MG-10 MCG / 10 MCG PO TABS: 1.0000 | ORAL_TABLET | Freq: Every day | ORAL | 3 refills | Status: DC

## 2022-08-12 MED ORDER — VALACYCLOVIR HCL 500 MG PO TABS: 500.0000 mg | ORAL_TABLET | Freq: Every day | ORAL | 12 refills | Status: AC

## 2022-08-12 NOTE — Progress Notes (Signed)
   GYNECOLOGY ANNUAL PREVENTATIVE CARE ENCOUNTER NOTE  Subjective:   Lacey Guerrero is a 32 y.o. [phone] female here for a routine annual gynecologic exam.  Current complaints: none-- here for breast exam after weaning.   Denies abnormal vaginal bleeding, discharge, pelvic pain, problems with intercourse or other gynecologic concerns.    Gynecologic History Patient's last menstrual period was 05/26/2022. Contraception: oral progesterone-only contraceptive Last Pap: 2022. Results were: normal Last mammogram: NA. Has implants  Health Maintenance Due  Topic Date Due   COVID-19 Vaccine (4 - 2023-24 season) 04/01/2022    The following portions of the patient's history were reviewed and updated as appropriate: allergies, current medications, past family history, past medical history, past social history, past surgical history and problem list.  Review of Systems Pertinent items are noted in HPI.   Objective:  BP 117/79   Pulse 71   Ht 5\' 6"  (1.676 m)   Wt 140 lb (63.5 kg)   LMP 05/26/2022   BMI 22.60 kg/m  Gen: well appearing, NAD HEENT: no scleral icterus CV: RR Lung: Normal WOB Ext: warm well perfused  BREASTS: Symmetric in size. No masses, skin changes, nipple drainage, or lymphadenopathy. Implants present but no nodularity/tension. Prior exam findings likely due to lactaion.    Assessment and Plan:  1) Annual gynecologic examination --pap up to date Routine preventative health maintenance measures emphasized.  2) Contraception counseling: Reviewed all forms of birth control options available including abstinence; over the counter/barrier methods; hormonal contraceptive medication including pill, patch, ring, injection,contraceptive implant; hormonal and nonhormonal IUDs; permanent sterilization options including vasectomy and the various tubal sterilization modalities. Risks and benefits reviewed.  Questions were answered.  Written information was also given to the patient to  review.  Patient desires POP, this was prescribed for patient. She will follow up in  3yr for surveillance.  She was told to call with any further questions, or with any concerns about this method of contraception.  Emphasized use of condoms 100% of the time for STI prevention.  1. Well woman exam with routine gynecological exam - CBC - Comprehensive metabolic panel - Lipid panel - Hemoglobin A1c - TSH  2. Encounter for surveillance of contraceptive pills - Norethindrone-Ethinyl Estradiol-Fe Biphas (LO LOESTRIN FE) 1 MG-10 MCG / 10 MCG tablet; Take 1 tablet by mouth daily.  Dispense: 84 tablet; Refill: 3  3. Herpes simplex type 1 infection - valACYclovir (VALTREX) 500 MG tablet; Take 1 tablet (500 mg total) by mouth daily. Can increase to twice a day for 5 days in the event of a recurrence  Dispense: 60 tablet; Refill: 12  4. Flu vaccine need - Flu Vaccine QUAD 33mo+IM (Fluarix, Fluzone & Alfiuria Quad PF)'   Please refer to After Visit Summary for other counseling recommendations.   No follow-ups on file.  Caren Macadam, MD, MPH, ABFM Attending Physician Center for [facility]

## 2022-08-12 NOTE — Progress Notes (Signed)
Patient presents for Annual.  LMP:05/26/2022 pt states will BCP period was 3 months ago took at home UPT x 3 wks ago was Negative. Last pap:10/07/2020 No pap today.  Contraception:OCP's will need refill Mammogram: N/A ptt notes having implants wants exam  STD Screening: Declines  Pt will do Annual Labs  CC: None   Fun Fact: Patient has a baby tooth.    Pt will like Valtrex 500mg  sent instead.  Pt states with current Rx she never has enough pills for flare up.

## 2022-08-13 LAB — COMPREHENSIVE METABOLIC PANEL
ALT: 12 IU/L (ref 0–32)
AST: 15 IU/L (ref 0–40)
Albumin/Globulin Ratio: 1.7 (ref 1.2–2.2)
Albumin: 4.5 g/dL (ref 3.9–4.9)
Alkaline Phosphatase: 41 IU/L — ABNORMAL LOW (ref 44–121)
BUN/Creatinine Ratio: 17 (ref 9–23)
BUN: 14 mg/dL (ref 6–20)
Bilirubin Total: 0.5 mg/dL (ref 0.0–1.2)
CO2: 20 mmol/L (ref 20–29)
Calcium: 9 mg/dL (ref 8.7–10.2)
Chloride: 105 mmol/L (ref 96–106)
Creatinine, Ser: 0.84 mg/dL (ref 0.57–1.00)
Globulin, Total: 2.6 g/dL (ref 1.5–4.5)
Glucose: 85 mg/dL (ref 70–99)
Potassium: 4.7 mmol/L (ref 3.5–5.2)
Sodium: 138 mmol/L (ref 134–144)
Total Protein: 7.1 g/dL (ref 6.0–8.5)
eGFR: 95 mL/min/{1.73_m2} (ref >59)

## 2022-08-13 LAB — LIPID PANEL
Chol/HDL Ratio: 2.6 ratio (ref 0.0–4.4)
Cholesterol, Total: 153 mg/dL (ref 100–199)
HDL: 58 mg/dL (ref >39)
LDL Chol Calc (NIH): 79 mg/dL (ref 0–99)
Triglycerides: 84 mg/dL (ref 0–149)
VLDL Cholesterol Cal: 16 mg/dL (ref 5–40)

## 2022-08-13 LAB — CBC
Hematocrit: 41.1 % (ref 34.0–46.6)
Hemoglobin: 13.8 g/dL (ref 11.1–15.9)
MCH: 30.8 pg (ref 26.6–33.0)
MCHC: 33.6 g/dL (ref 31.5–35.7)
MCV: 92 fL (ref 79–97)
Platelets: 224 10*3/uL (ref 150–450)
RBC: 4.48 x10E6/uL (ref 3.77–5.28)
RDW: 11.7 % (ref 11.7–15.4)
WBC: 5.3 10*3/uL (ref 3.4–10.8)

## 2022-08-13 LAB — HEMOGLOBIN A1C
Est. average glucose Bld gHb Est-mCnc: 94 mg/dL
Hgb A1c MFr Bld: 4.9 % (ref 4.8–5.6)

## 2022-08-13 LAB — TSH: TSH: 2.29 u[IU]/mL (ref 0.450–4.500)

## 2022-11-08 ENCOUNTER — Other Ambulatory Visit: Payer: Self-pay | Admitting: *Deleted

## 2022-11-08 DIAGNOSIS — Z3041 Encounter for surveillance of contraceptive pills: Secondary | ICD-10-CM

## 2022-11-08 MED ORDER — NORETHIN-ETH ESTRAD-FE BIPHAS 1 MG-10 MCG / 10 MCG PO TABS: 1.0000 | ORAL_TABLET | Freq: Every day | ORAL | 3 refills | Status: DC

## 2022-11-08 NOTE — Progress Notes (Signed)
Pt need RX to be sent to new pharmacy for insurance to cover it

## 2023-02-28 ENCOUNTER — Other Ambulatory Visit: Payer: Self-pay | Admitting: Family Medicine

## 2023-02-28 DIAGNOSIS — Z3041 Encounter for surveillance of contraceptive pills: Secondary | ICD-10-CM

## 2023-08-21 ENCOUNTER — Other Ambulatory Visit: Payer: Self-pay | Admitting: *Deleted

## 2023-08-21 DIAGNOSIS — Z3041 Encounter for surveillance of contraceptive pills: Secondary | ICD-10-CM

## 2023-08-21 MED ORDER — NORETHIN-ETH ESTRAD-FE BIPHAS 1 MG-10 MCG / 10 MCG PO TABS: 1.0000 | ORAL_TABLET | Freq: Every day | ORAL | 0 refills | Status: DC

## 2023-11-08 ENCOUNTER — Other Ambulatory Visit: Payer: Self-pay | Admitting: Family Medicine

## 2023-11-08 DIAGNOSIS — Z3041 Encounter for surveillance of contraceptive pills: Secondary | ICD-10-CM

## 2023-11-09 ENCOUNTER — Other Ambulatory Visit: Payer: Self-pay | Admitting: Family Medicine

## 2023-11-09 DIAGNOSIS — Z3041 Encounter for surveillance of contraceptive pills: Secondary | ICD-10-CM

## 2023-11-09 MED ORDER — NORETHIN-ETH ESTRAD-FE BIPHAS 1 MG-10 MCG / 10 MCG PO TABS: 1.0000 | ORAL_TABLET | Freq: Every day | ORAL | 0 refills | Status: AC

## 2024-06-12 ENCOUNTER — Other Ambulatory Visit (HOSPITAL_COMMUNITY)
Admission: RE | Admit: 2024-06-12 | Discharge: 2024-06-12 | Disposition: A | Discharge status: Home / Self Care | Source: Ambulatory Visit | Attending: Certified Nurse Midwife | Admitting: Certified Nurse Midwife

## 2024-06-12 ENCOUNTER — Ambulatory Visit (INDEPENDENT_AMBULATORY_CARE_PROVIDER_SITE_OTHER): Admitting: Certified Nurse Midwife

## 2024-06-12 VITALS — BP 127/87 | HR 59 | Ht 66.0 in | Wt 146.5 lb

## 2024-06-12 DIAGNOSIS — Z113 Encounter for screening for infections with a predominantly sexual mode of transmission: Secondary | ICD-10-CM

## 2024-06-12 DIAGNOSIS — Z30011 Encounter for initial prescription of contraceptive pills: Secondary | ICD-10-CM

## 2024-06-12 DIAGNOSIS — Z01419 Encounter for gynecological examination (general) (routine) without abnormal findings: Secondary | ICD-10-CM | POA: Insufficient documentation

## 2024-06-12 DIAGNOSIS — Z124 Encounter for screening for malignant neoplasm of cervix: Secondary | ICD-10-CM | POA: Diagnosis present

## 2024-06-12 DIAGNOSIS — B351 Tinea unguium: Secondary | ICD-10-CM | POA: Diagnosis not present

## 2024-06-12 MED ORDER — VALACYCLOVIR HCL 500 MG PO TABS: 500.0000 mg | ORAL_TABLET | Freq: Every day | ORAL | 12 refills | Status: AC

## 2024-06-12 MED ORDER — NORETHIN ACE-ETH ESTRAD-FE 1-20 MG-MCG(24) PO TABS: 1.0000 | ORAL_TABLET | Freq: Every day | ORAL | 11 refills | Status: AC

## 2024-06-12 MED ORDER — NORETHIN-ETH ESTRAD-FE BIPHAS 1 MG-10 MCG / 10 MCG PO TABS: 1.0000 | ORAL_TABLET | Freq: Every day | ORAL | 11 refills | Status: DC

## 2024-06-12 NOTE — Progress Notes (Signed)
 GYNECOLOGY OFFICE VISIT NOTE  History:   Lacey Guerrero is a 33 y.o. [phone] here today for annual well woman exam. She currently is on OCPs and reports not having a cycle. She states that she still experiences symptoms of PMS on her placebo week. She desires to continue [facility] pills. She performed SBE at home and has an US  scheduled in January to have her breast implants looked at. Pt got them in [place] several years ago and has not had any complications. She request a breast exam today . She denies any abnormal vaginal discharge, bleeding, pelvic pain or other concerns.   She does have concerns for a toenail fungus that she has been on-gong. Reports her mother is also experiencing something similar. She states that its not painful but she has noticed that the nail bed itself has started to become brittle.     Past Medical History:  Diagnosis Date   Medical history non-contributory     Past Surgical History:  Procedure Laterality Date   PLACEMENT OF BREAST IMPLANTS Bilateral 2020   WISDOM TOOTH EXTRACTION      The following portions of the patient's history were reviewed and updated as appropriate: allergies, current medications, past family history, past medical history, past social history, past surgical history and problem list.   Health Maintenance:  Normal pap and negative HRHPV on 10/07/20.  Patient had a normal breast US  in [place] 1 year ago.   Review of Systems:  Pertinent items noted in HPI and remainder of comprehensive ROS otherwise negative.  Physical Exam:  BP 127/87   Pulse (!) 59   Ht 5' 6 (1.676 m)   Wt 146 lb 8 oz (66.5 kg)   BMI 23.65 kg/m  CONSTITUTIONAL: Well-developed, well-nourished female in no acute distress.  HEENT:  Normocephalic, atraumatic. External right and left ear normal. No scleral icterus.  NECK: Normal range of motion, supple, no masses noted on observation SKIN: No rash noted. Not diaphoretic. No erythema. No pallor.  FEET: raised nailbeds of  both big toes with thickened areas around the cuticle. ** See images below**  Breasts: breasts appear normal, no suspicious masses, no skin or nipple changes or axillary nodes, bilateral implants, no palpable abnormalities otherwise.  MUSCULOSKELETAL: Normal range of motion. No edema noted. NEUROLOGIC: Alert and oriented to person, place, and time. Normal muscle tone coordination. No cranial nerve deficit noted. PSYCHIATRIC: Normal mood and affect. Normal behavior. Normal judgment and thought content. CARDIOVASCULAR: Normal heart rate noted RESPIRATORY: Effort and breath sounds normal, no problems with respiration noted ABDOMEN: No masses noted. No other overt distention noted.   PELVIC: Normal appearing external genitalia; normal urethral meatus; normal appearing vaginal mucosa and cervix.  No abnormal discharge noted.  Normal uterine size, no other palpable masses, no uterine or adnexal tenderness. Performed in the presence of a chaperone. Neva Hyler CMA     Imaging  Assessment and Plan:    1. Well woman exam with routine gynecological exam (Primary) - Patient doing well.  - Cytology - PAP - CBC - Comprehensive metabolic panel with GFR - Lipid panel - TSH - Hemoglobin A1c  2. OCP (oral contraceptive pills) initiation - Refilled placed for OCPs   3. Pap smear for cervical cancer screening - Cytology - PAP  4. Toenail fungus - Ambulatory referral to Dermatology - In the interim encouraged to keep area clean and dry and try topical OTC options.   5. Screen for STD (sexually transmitted disease) - STD panel collected  today at patient request.  - Cytology - PAP   Routine preventative health maintenance measures emphasized. Please refer to After Visit Summary for other counseling recommendations.   Return in about 1 year (around 06/12/2025) for [place].    I spent 30 minutes dedicated to the care of this patient including pre-visit review of records, face to face time with the  patient discussing her conditions and treatments and post visit orders.    Timtohy Broski Erven) Emilio, MSN, CNM  [facility]  06/12/24 12:37 PM

## 2024-06-13 LAB — COMPREHENSIVE METABOLIC PANEL WITH GFR
ALT: 11 IU/L (ref 0–32)
AST: 13 IU/L (ref 0–40)
Albumin: 4.5 g/dL (ref 3.9–4.9)
Alkaline Phosphatase: 47 IU/L (ref 41–116)
BUN/Creatinine Ratio: 22 (ref 9–23)
BUN: 16 mg/dL (ref 6–20)
Bilirubin Total: 0.3 mg/dL (ref 0.0–1.2)
CO2: 20 mmol/L (ref 20–29)
Calcium: 9.3 mg/dL (ref 8.7–10.2)
Chloride: 102 mmol/L (ref 96–106)
Creatinine, Ser: 0.72 mg/dL (ref 0.57–1.00)
Globulin, Total: 2.5 g/dL (ref 1.5–4.5)
Glucose: 76 mg/dL (ref 70–99)
Potassium: 4.2 mmol/L (ref 3.5–5.2)
Sodium: 138 mmol/L (ref 134–144)
Total Protein: 7 g/dL (ref 6.0–8.5)
eGFR: 114 mL/min/1.73 (ref >59)

## 2024-06-13 LAB — HEMOGLOBIN A1C
Est. average glucose Bld gHb Est-mCnc: 91 mg/dL
Hgb A1c MFr Bld: 4.8 % (ref 4.8–5.6)

## 2024-06-13 LAB — LIPID PANEL
Chol/HDL Ratio: 3 ratio (ref 0.0–4.4)
Cholesterol, Total: 159 mg/dL (ref 100–199)
HDL: 53 mg/dL (ref >39)
LDL Chol Calc (NIH): 86 mg/dL (ref 0–99)
Triglycerides: 114 mg/dL (ref 0–149)
VLDL Cholesterol Cal: 20 mg/dL (ref 5–40)

## 2024-06-13 LAB — CBC
Hematocrit: 41.1 % (ref 34.0–46.6)
Hemoglobin: 13.6 g/dL (ref 11.1–15.9)
MCH: 31 pg (ref 26.6–33.0)
MCHC: 33.1 g/dL (ref 31.5–35.7)
MCV: 94 fL (ref 79–97)
Platelets: 273 x10E3/uL (ref 150–450)
RBC: 4.39 x10E6/uL (ref 3.77–5.28)
RDW: 11.2 % — ABNORMAL LOW (ref 11.7–15.4)
WBC: 7.1 x10E3/uL (ref 3.4–10.8)

## 2024-06-13 LAB — TSH: TSH: 2.34 u[IU]/mL (ref 0.450–4.500)

## 2024-06-15 ENCOUNTER — Ambulatory Visit: Payer: Self-pay | Admitting: Certified Nurse Midwife

## 2024-06-18 ENCOUNTER — Ambulatory Visit: Admitting: Dermatology

## 2024-06-18 ENCOUNTER — Encounter: Payer: Self-pay | Admitting: Dermatology

## 2024-06-18 DIAGNOSIS — B372 Candidiasis of skin and nail: Secondary | ICD-10-CM

## 2024-06-18 DIAGNOSIS — B351 Tinea unguium: Secondary | ICD-10-CM

## 2024-06-18 LAB — CYTOLOGY - PAP
Chlamydia: NEGATIVE
Comment: NEGATIVE
Comment: NEGATIVE
Comment: NEGATIVE
Comment: NORMAL
Diagnosis: NEGATIVE
Diagnosis: REACTIVE
High risk HPV: NEGATIVE
Neisseria Gonorrhea: NEGATIVE
Trichomonas: NEGATIVE

## 2024-06-18 MED ORDER — KETOCONAZOLE 2 % EX CREA: TOPICAL_CREAM | CUTANEOUS | 5 refills | Status: AC

## 2024-06-18 NOTE — Patient Instructions (Signed)

## 2024-06-18 NOTE — Progress Notes (Signed)
   New Patient Visit   Subjective  Lacey Guerrero is a 33 y.o. female who presents for the following: Toenail fingus concerns present for a long time, has not progressively gotten worse. Patient was prescribed ciclopirox 8% solution by virtual visit x 2.5 weeks ago and has noticed some improvement.   The following portions of the chart were reviewed this encounter and updated as appropriate: medications, allergies, medical history  Review of Systems:  No other skin or systemic complaints except as noted in HPI or Assessment and Plan.  Objective  Well appearing patient in no apparent distress; mood and affect are within normal limits.  A focused examination was performed of the following areas: Bilateral feet  Relevant exam findings are noted in the Assessment and Plan.                 Assessment & Plan   Superficial white onychomycosis, chronic, improving with treatment, not at goal Exam: white discoloration of bilateral first toenail plates.   Photos of photos show condition before treatment Photos from visit show improvement with ciclopirox  Treatment Plan: Continue ciclopirox 8% solution nightly x 7 then remove and continue Avoid frictional injury to nails. ensure footwear fits well, especially the ones used for exercise Advised using a fresh polish a using a q-tip/ new applicator each time to avoid re-infecting.   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 Claudene.  I, Jacquelynn V. Wilfred, CMA, am acting as scribe for Boneta Claudene, MD.  Documentation: I have reviewed the above documentation for accuracy and completeness,  and I agree with the above.  Boneta Claudene, MD

## 2024-07-30 ENCOUNTER — Encounter: Payer: Self-pay | Admitting: Certified Nurse Midwife

## 2024-12-16 ENCOUNTER — Ambulatory Visit: Admitting: Dermatology
